# Patient Record
Sex: Male | Born: 1970
Health system: Southern US, Community
[De-identification: ages and names within clinical notes are randomized; demographics above are authoritative.]

## PROBLEM LIST (undated history)

## (undated) DIAGNOSIS — M1990 Unspecified osteoarthritis, unspecified site: Secondary | ICD-10-CM

## (undated) DIAGNOSIS — J449 Chronic obstructive pulmonary disease, unspecified: Secondary | ICD-10-CM

## (undated) HISTORY — PX: SHOULDER ARTHROSCOPY: SHX128

---

## 1998-08-18 ENCOUNTER — Encounter: Payer: Self-pay | Admitting: Emergency Medicine

## 1998-08-18 ENCOUNTER — Emergency Department (HOSPITAL_COMMUNITY): Admission: EM | Admit: 1998-08-18 | Discharge: 1998-08-18 | Payer: Self-pay | Admitting: Emergency Medicine

## 1998-11-09 ENCOUNTER — Emergency Department (HOSPITAL_COMMUNITY): Admission: EM | Admit: 1998-11-09 | Discharge: 1998-11-09 | Payer: Self-pay | Admitting: Emergency Medicine

## 1999-05-28 ENCOUNTER — Emergency Department (HOSPITAL_COMMUNITY): Admission: EM | Admit: 1999-05-28 | Discharge: 1999-05-28 | Payer: Self-pay | Admitting: *Deleted

## 1999-05-28 ENCOUNTER — Encounter: Payer: Self-pay | Admitting: Emergency Medicine

## 1999-07-04 ENCOUNTER — Emergency Department (HOSPITAL_COMMUNITY): Admission: EM | Admit: 1999-07-04 | Discharge: 1999-07-05 | Payer: Self-pay | Admitting: Emergency Medicine

## 1999-07-05 ENCOUNTER — Encounter: Payer: Self-pay | Admitting: Emergency Medicine

## 1999-07-26 ENCOUNTER — Ambulatory Visit (HOSPITAL_COMMUNITY): Admission: RE | Admit: 1999-07-26 | Discharge: 1999-07-26 | Payer: Self-pay | Admitting: Orthopedic Surgery

## 1999-07-26 ENCOUNTER — Encounter: Payer: Self-pay | Admitting: Orthopedic Surgery

## 1999-07-29 ENCOUNTER — Encounter: Admission: RE | Admit: 1999-07-29 | Discharge: 1999-08-11 | Payer: Self-pay

## 1999-09-09 ENCOUNTER — Emergency Department (HOSPITAL_COMMUNITY): Admission: EM | Admit: 1999-09-09 | Discharge: 1999-09-09 | Payer: Self-pay | Admitting: Emergency Medicine

## 2000-04-30 ENCOUNTER — Emergency Department (HOSPITAL_COMMUNITY): Admission: EM | Admit: 2000-04-30 | Discharge: 2000-04-30 | Payer: Self-pay | Admitting: Emergency Medicine

## 2000-11-16 ENCOUNTER — Encounter: Payer: Self-pay | Admitting: Emergency Medicine

## 2000-11-16 ENCOUNTER — Emergency Department (HOSPITAL_COMMUNITY): Admission: EM | Admit: 2000-11-16 | Discharge: 2000-11-16 | Payer: Self-pay | Admitting: Emergency Medicine

## 2001-05-26 ENCOUNTER — Encounter: Admission: RE | Admit: 2001-05-26 | Discharge: 2001-05-26 | Payer: Self-pay | Admitting: *Deleted

## 2001-06-06 ENCOUNTER — Encounter: Payer: Self-pay | Admitting: Emergency Medicine

## 2001-06-06 ENCOUNTER — Emergency Department (HOSPITAL_COMMUNITY): Admission: EM | Admit: 2001-06-06 | Discharge: 2001-06-06 | Payer: Self-pay | Admitting: Emergency Medicine

## 2001-09-26 ENCOUNTER — Emergency Department (HOSPITAL_COMMUNITY): Admission: EM | Admit: 2001-09-26 | Discharge: 2001-09-26 | Payer: Self-pay | Admitting: Emergency Medicine

## 2001-09-26 ENCOUNTER — Encounter: Payer: Self-pay | Admitting: Emergency Medicine

## 2001-11-27 ENCOUNTER — Emergency Department (HOSPITAL_COMMUNITY): Admission: EM | Admit: 2001-11-27 | Discharge: 2001-11-27 | Payer: Self-pay | Admitting: Emergency Medicine

## 2003-12-04 ENCOUNTER — Emergency Department (HOSPITAL_COMMUNITY): Admission: EM | Admit: 2003-12-04 | Discharge: 2003-12-04 | Payer: Self-pay

## 2004-03-21 ENCOUNTER — Encounter: Admission: RE | Admit: 2004-03-21 | Discharge: 2004-03-21 | Payer: Self-pay | Admitting: Family Medicine

## 2004-08-25 ENCOUNTER — Emergency Department (HOSPITAL_COMMUNITY): Admission: EM | Admit: 2004-08-25 | Discharge: 2004-08-25 | Payer: Self-pay | Admitting: Family Medicine

## 2004-09-30 ENCOUNTER — Emergency Department (HOSPITAL_COMMUNITY): Admission: EM | Admit: 2004-09-30 | Discharge: 2004-09-30 | Payer: Self-pay | Admitting: Emergency Medicine

## 2004-10-09 ENCOUNTER — Emergency Department (HOSPITAL_COMMUNITY): Admission: EM | Admit: 2004-10-09 | Discharge: 2004-10-09 | Payer: Self-pay | Admitting: Emergency Medicine

## 2006-08-24 ENCOUNTER — Emergency Department (HOSPITAL_COMMUNITY): Admission: EM | Admit: 2006-08-24 | Discharge: 2006-08-24 | Payer: Self-pay | Admitting: Emergency Medicine

## 2007-02-24 ENCOUNTER — Emergency Department (HOSPITAL_COMMUNITY): Admission: EM | Admit: 2007-02-24 | Discharge: 2007-02-24 | Payer: Self-pay | Admitting: Family Medicine

## 2007-07-16 ENCOUNTER — Emergency Department (HOSPITAL_COMMUNITY): Admission: EM | Admit: 2007-07-16 | Discharge: 2007-07-16 | Payer: Self-pay | Admitting: Emergency Medicine

## 2007-07-17 ENCOUNTER — Emergency Department (HOSPITAL_COMMUNITY): Admission: EM | Admit: 2007-07-17 | Discharge: 2007-07-17 | Payer: Self-pay | Admitting: Emergency Medicine

## 2007-07-24 ENCOUNTER — Emergency Department (HOSPITAL_COMMUNITY): Admission: EM | Admit: 2007-07-24 | Discharge: 2007-07-24 | Payer: Self-pay | Admitting: Emergency Medicine

## 2007-10-10 ENCOUNTER — Ambulatory Visit: Payer: Self-pay | Admitting: Critical Care Medicine

## 2007-10-10 DIAGNOSIS — J309 Allergic rhinitis, unspecified: Secondary | ICD-10-CM | POA: Insufficient documentation

## 2007-10-10 DIAGNOSIS — M1990 Unspecified osteoarthritis, unspecified site: Secondary | ICD-10-CM | POA: Insufficient documentation

## 2007-10-10 DIAGNOSIS — J45909 Unspecified asthma, uncomplicated: Secondary | ICD-10-CM | POA: Insufficient documentation

## 2007-10-11 DIAGNOSIS — F17200 Nicotine dependence, unspecified, uncomplicated: Secondary | ICD-10-CM | POA: Insufficient documentation

## 2007-10-11 DIAGNOSIS — J441 Chronic obstructive pulmonary disease with (acute) exacerbation: Secondary | ICD-10-CM | POA: Insufficient documentation

## 2007-10-30 ENCOUNTER — Ambulatory Visit: Payer: Self-pay | Admitting: Critical Care Medicine

## 2007-11-02 ENCOUNTER — Telehealth (INDEPENDENT_AMBULATORY_CARE_PROVIDER_SITE_OTHER): Payer: Self-pay | Admitting: *Deleted

## 2009-11-26 ENCOUNTER — Emergency Department (HOSPITAL_COMMUNITY): Admission: EM | Admit: 2009-11-26 | Discharge: 2009-11-26 | Payer: Self-pay | Admitting: Emergency Medicine

## 2010-01-18 ENCOUNTER — Emergency Department (HOSPITAL_COMMUNITY): Admission: EM | Admit: 2010-01-18 | Discharge: 2010-01-18 | Payer: Self-pay | Admitting: Emergency Medicine

## 2010-04-27 ENCOUNTER — Emergency Department (HOSPITAL_COMMUNITY): Admission: EM | Admit: 2010-04-27 | Discharge: 2010-04-27 | Payer: Self-pay | Admitting: Emergency Medicine

## 2010-08-09 ENCOUNTER — Encounter: Payer: Self-pay | Admitting: Family Medicine

## 2010-08-09 ENCOUNTER — Emergency Department (HOSPITAL_COMMUNITY)
Admission: EM | Admit: 2010-08-09 | Discharge: 2010-08-09 | Payer: Self-pay | Source: Home / Self Care | Admitting: Emergency Medicine

## 2010-10-04 LAB — HEPATIC FUNCTION PANEL
ALT: 17 U/L (ref 0–53)
AST: 25 U/L (ref 0–37)
Albumin: 4.2 g/dL (ref 3.5–5.2)
Alkaline Phosphatase: 66 U/L (ref 39–117)
Bilirubin, Direct: 0.1 mg/dL (ref 0.0–0.3)
Indirect Bilirubin: 0.7 mg/dL (ref 0.3–0.9)
Total Bilirubin: 0.8 mg/dL (ref 0.3–1.2)
Total Protein: 7.3 g/dL (ref 6.0–8.3)

## 2010-10-04 LAB — RAPID URINE DRUG SCREEN, HOSP PERFORMED
Amphetamines: NOT DETECTED
Barbiturates: NOT DETECTED
Benzodiazepines: POSITIVE — AB
Cocaine: POSITIVE — AB
Opiates: NOT DETECTED
Tetrahydrocannabinol: POSITIVE — AB

## 2010-10-04 LAB — CBC
HCT: 46.4 % (ref 39.0–52.0)
Hemoglobin: 16.1 g/dL (ref 13.0–17.0)
MCH: 32.7 pg (ref 26.0–34.0)
MCHC: 34.6 g/dL (ref 30.0–36.0)
MCV: 94.5 fL (ref 78.0–100.0)
Platelets: 239 10*3/uL (ref 150–400)
RBC: 4.91 MIL/uL (ref 4.22–5.81)
RDW: 12.9 % (ref 11.5–15.5)
WBC: 6.4 10*3/uL (ref 4.0–10.5)

## 2010-10-04 LAB — DIFFERENTIAL
Basophils Absolute: 0.1 10*3/uL (ref 0.0–0.1)
Basophils Relative: 1 % (ref 0–1)
Eosinophils Absolute: 0.1 10*3/uL (ref 0.0–0.7)
Eosinophils Relative: 2 % (ref 0–5)
Lymphocytes Relative: 28 % (ref 12–46)
Lymphs Abs: 1.8 10*3/uL (ref 0.7–4.0)
Monocytes Absolute: 0.6 10*3/uL (ref 0.1–1.0)
Monocytes Relative: 10 % (ref 3–12)
Neutro Abs: 3.8 10*3/uL (ref 1.7–7.7)
Neutrophils Relative %: 59 % (ref 43–77)

## 2010-10-04 LAB — BASIC METABOLIC PANEL
BUN: 5 mg/dL — ABNORMAL LOW (ref 6–23)
CO2: 22 mEq/L (ref 19–32)
Calcium: 9.1 mg/dL (ref 8.4–10.5)
Chloride: 109 mEq/L (ref 96–112)
Creatinine, Ser: 1.02 mg/dL (ref 0.4–1.5)
GFR calc Af Amer: >60 mL/min (ref >60)
GFR calc non Af Amer: >60 mL/min (ref >60)
Glucose, Bld: 97 mg/dL (ref 70–99)
Potassium: 3.5 mEq/L (ref 3.5–5.1)
Sodium: 138 mEq/L (ref 135–145)

## 2010-10-04 LAB — URINALYSIS, ROUTINE W REFLEX MICROSCOPIC
Bilirubin Urine: NEGATIVE
Glucose, UA: NEGATIVE mg/dL
Hgb urine dipstick: NEGATIVE
Ketones, ur: NEGATIVE mg/dL
Nitrite: NEGATIVE
Protein, ur: NEGATIVE mg/dL
Specific Gravity, Urine: 1.01 (ref 1.005–1.030)
Urobilinogen, UA: 0.2 mg/dL (ref 0.0–1.0)
pH: 5.5 (ref 5.0–8.0)

## 2010-10-04 LAB — ETHANOL: Alcohol, Ethyl (B): <5 mg/dL (ref 0–10)

## 2011-06-07 ENCOUNTER — Encounter: Payer: Self-pay | Admitting: *Deleted

## 2011-06-07 ENCOUNTER — Emergency Department (HOSPITAL_BASED_OUTPATIENT_CLINIC_OR_DEPARTMENT_OTHER)
Admission: EM | Admit: 2011-06-07 | Discharge: 2011-06-07 | Disposition: A | Discharge status: Home / Self Care | Payer: Self-pay | Attending: Emergency Medicine | Admitting: Emergency Medicine

## 2011-06-07 DIAGNOSIS — R062 Wheezing: Secondary | ICD-10-CM

## 2011-06-07 DIAGNOSIS — F419 Anxiety disorder, unspecified: Secondary | ICD-10-CM

## 2011-06-07 DIAGNOSIS — R05 Cough: Secondary | ICD-10-CM | POA: Insufficient documentation

## 2011-06-07 DIAGNOSIS — J449 Chronic obstructive pulmonary disease, unspecified: Secondary | ICD-10-CM | POA: Insufficient documentation

## 2011-06-07 DIAGNOSIS — F17200 Nicotine dependence, unspecified, uncomplicated: Secondary | ICD-10-CM | POA: Insufficient documentation

## 2011-06-07 DIAGNOSIS — R059 Cough, unspecified: Secondary | ICD-10-CM

## 2011-06-07 DIAGNOSIS — J4489 Other specified chronic obstructive pulmonary disease: Secondary | ICD-10-CM | POA: Insufficient documentation

## 2011-06-07 DIAGNOSIS — F411 Generalized anxiety disorder: Secondary | ICD-10-CM | POA: Insufficient documentation

## 2011-06-07 DIAGNOSIS — Z8739 Personal history of other diseases of the musculoskeletal system and connective tissue: Secondary | ICD-10-CM | POA: Insufficient documentation

## 2011-06-07 HISTORY — DX: Unspecified osteoarthritis, unspecified site: M19.90

## 2011-06-07 HISTORY — DX: Chronic obstructive pulmonary disease, unspecified: J44.9

## 2011-06-07 MED ORDER — ALBUTEROL SULFATE HFA 108 (90 BASE) MCG/ACT IN AERS: 2.0000 | INHALATION_SPRAY | RESPIRATORY_TRACT | Status: DC | PRN

## 2011-06-07 MED ORDER — ALBUTEROL SULFATE (5 MG/ML) 0.5% IN NEBU
5.0000 mg | INHALATION_SOLUTION | Freq: Once | RESPIRATORY_TRACT | Status: AC
  Administered 2011-06-07: 5 mg via RESPIRATORY_TRACT
  Filled 2011-06-07: qty 1

## 2011-06-07 MED ORDER — IPRATROPIUM BROMIDE 0.02 % IN SOLN
0.5000 mg | Freq: Once | RESPIRATORY_TRACT | Status: AC
  Administered 2011-06-07: 0.5 mg via RESPIRATORY_TRACT
  Filled 2011-06-07: qty 2.5

## 2011-06-07 MED ORDER — PREDNISONE 50 MG PO TABS
60.0000 mg | ORAL_TABLET | Freq: Once | ORAL | Status: AC
  Administered 2011-06-07: 60 mg via ORAL
  Filled 2011-06-07: qty 1

## 2011-06-07 MED ORDER — ALPRAZOLAM 0.5 MG PO TABS: 0.5000 mg | ORAL_TABLET | Freq: Three times a day (TID) | ORAL | Status: AC | PRN

## 2011-06-07 MED ORDER — PREDNISONE 20 MG PO TABS: 60.0000 mg | ORAL_TABLET | Freq: Every day | ORAL | Status: AC

## 2011-06-07 NOTE — ED Notes (Signed)
Patient states he has had a sinus congestion and cough for the last month.  Progressively gotten worse.  Also has stopped smoking Pot for the last 36 days and is feeling gitter ly and anxious. Using otc cold medications with minimal relief.

## 2011-06-07 NOTE — ED Provider Notes (Signed)
History     CSN: [id number] Arrival date & time: 06/07/2011  8:57 AM   First MD Initiated Contact with Patient 06/07/11 0901      Chief Complaint  Patient presents with  . Cough    sinus congestion    (Consider location/radiation/quality/duration/timing/severity/associated sxs/prior treatment) Patient is a 40 y.o. male presenting with cough. The history is provided by the patient.  Cough Pertinent negatives include no chest pain, no headaches, no shortness of breath and no eye redness.  pt c/o non prod cough in past couple weeks. Also nasal congestion. No purulent nasal drainage. No sinus pain/tenderness. No headache. Non prod cough. Hx asthma, copd. Smoker, in midst of trying to quit. Denies chest pain or sob. Has run out of inhaler. w copd/asthma, denies prior admit, has used mdis prn. No sore throat. No fever or chills. No known ill contacts. Also notes hx anxiety, and states stopping smoking and thc use is causing him to feel anxious at times, requests rx for same. Denies depression. No pain. w above symptoms, denies specific exacerbating or allev factors.   Past Medical History  Diagnosis Date  . Arthritis   . COPD (chronic obstructive pulmonary disease)     Past Surgical History  Procedure Date  . Shoulder arthroscopy     bone spurs and arthritis    No family history on file.  History  Substance Use Topics  . Smoking status: Current Some Day Smoker -- 1.0 packs/day    Types: Cigarettes  . Smokeless tobacco: Not on file  . Alcohol Use: Yes     rarely      Review of Systems  Constitutional: Negative for fever.  HENT: Negative for neck pain.   Eyes: Negative for redness.  Respiratory: Positive for cough. Negative for shortness of breath.   Cardiovascular: Negative for chest pain and leg swelling.  Gastrointestinal: Negative for abdominal pain.  Musculoskeletal: Negative for back pain.  Skin: Negative for rash.  Neurological: Negative for headaches.    Hematological: Does not bruise/bleed easily.  Psychiatric/Behavioral: The patient is nervous/anxious.     Allergies  Morphine and related  Home Medications   Current Outpatient Rx  Name Route Sig Dispense Refill  . NAPROXEN SODIUM 220 MG PO TABS Oral Take 500 mg by mouth 2 (two) times daily with a meal.        BP 128/82  Pulse 98  Temp(Src) 97.8 F (36.6 C) (Oral)  Resp 18  Ht 6' (1.829 m)  Wt 150 lb (68.04 kg)  BMI 20.34 kg/m2  SpO2 97%  Physical Exam  Nursing note and vitals reviewed. Constitutional: He is oriented to person, place, and time. He appears well-developed and well-nourished. No distress.  HENT:  Head: Atraumatic.  Mouth/Throat: Oropharynx is clear and moist.  Eyes: Pupils are equal, round, and reactive to light. Right eye exhibits no discharge. Left eye exhibits no discharge.  Neck: Neck supple. No tracheal deviation present.  Cardiovascular: Normal rate, regular rhythm, normal heart sounds and intact distal pulses.  Exam reveals no gallop and no friction rub.   No murmur heard. Pulmonary/Chest: Effort normal. No accessory muscle usage. No respiratory distress. He has wheezes. He has no rales.  Abdominal: Soft. He exhibits no distension. There is no tenderness.  Musculoskeletal: Normal range of motion. He exhibits no edema and no tenderness.  Neurological: He is alert and oriented to person, place, and time.  Skin: Skin is warm and dry.  Psychiatric:       Sl  anxious.     ED Course  Procedures (including critical care time)  Labs Reviewed - No data to display No results found.   No diagnosis found.    MDM  Albuterol and atrovent neb. Pred. Pt request rx anxiety at home. Will give small quantity rx xanax.    Recheck no wheezing or increased wob.     Suzi Roots, MD 06/07/11 1011

## 2011-07-30 ENCOUNTER — Emergency Department (HOSPITAL_COMMUNITY): Payer: Self-pay

## 2011-07-30 ENCOUNTER — Emergency Department (HOSPITAL_COMMUNITY)
Admission: EM | Admit: 2011-07-30 | Discharge: 2011-07-30 | Disposition: A | Discharge status: Home / Self Care | Payer: Self-pay | Attending: Emergency Medicine | Admitting: Emergency Medicine

## 2011-07-30 ENCOUNTER — Encounter (HOSPITAL_COMMUNITY): Payer: Self-pay | Admitting: *Deleted

## 2011-07-30 ENCOUNTER — Other Ambulatory Visit: Payer: Self-pay

## 2011-07-30 DIAGNOSIS — R071 Chest pain on breathing: Secondary | ICD-10-CM | POA: Insufficient documentation

## 2011-07-30 DIAGNOSIS — M549 Dorsalgia, unspecified: Secondary | ICD-10-CM | POA: Insufficient documentation

## 2011-07-30 DIAGNOSIS — R109 Unspecified abdominal pain: Secondary | ICD-10-CM | POA: Insufficient documentation

## 2011-07-30 DIAGNOSIS — M129 Arthropathy, unspecified: Secondary | ICD-10-CM | POA: Insufficient documentation

## 2011-07-30 DIAGNOSIS — J40 Bronchitis, not specified as acute or chronic: Secondary | ICD-10-CM | POA: Insufficient documentation

## 2011-07-30 DIAGNOSIS — F17200 Nicotine dependence, unspecified, uncomplicated: Secondary | ICD-10-CM | POA: Insufficient documentation

## 2011-07-30 LAB — URINALYSIS, ROUTINE W REFLEX MICROSCOPIC
Glucose, UA: NEGATIVE mg/dL
Hgb urine dipstick: NEGATIVE
Ketones, ur: 15 mg/dL — AB
Nitrite: NEGATIVE
Protein, ur: 30 mg/dL — AB
Urobilinogen, UA: 1 mg/dL (ref 0.0–1.0)

## 2011-07-30 LAB — URINE MICROSCOPIC-ADD ON

## 2011-07-30 LAB — D-DIMER, QUANTITATIVE: D-Dimer, Quant: 0.37 ug/mL-FEU (ref 0.00–0.48)

## 2011-07-30 MED ORDER — IBUPROFEN 600 MG PO TABS: 600.0000 mg | ORAL_TABLET | Freq: Four times a day (QID) | ORAL | Status: AC | PRN

## 2011-07-30 MED ORDER — KETOROLAC TROMETHAMINE 60 MG/2ML IM SOLN
60.0000 mg | Freq: Once | INTRAMUSCULAR | Status: AC
  Administered 2011-07-30: 60 mg via INTRAMUSCULAR
  Filled 2011-07-30: qty 2

## 2011-07-30 NOTE — ED Notes (Signed)
Returned from xray

## 2011-07-30 NOTE — ED Notes (Signed)
States coughing yesterday and hearing popping sound on left side of back followed by severe pain. Pt states hurts to take deep breath. Area tender to touch. Lung sounds equal bilaterally.

## 2011-07-30 NOTE — ED Provider Notes (Signed)
History     CSN: [id number]  Arrival date & time 07/30/11  7829   First MD Initiated Contact with Patient 07/30/11 0813      Chief Complaint  Patient presents with  . Back Pain    (Consider location/radiation/quality/duration/timing/severity/associated sxs/prior treatment) HPI  41 year old male presents to ED with chief complaints of back pain. Patient states he was driving yesterday when he coughed and heard a popping sound to his mid left back. He subsequently experiencing significant pain worsened with deep breathing to the affected area. Pain also worsen with movement.  He also notice he can't "put pressure to make urine".  Patient has tried Vicodin and muscle relaxant at home without relief. Nothing makes the pain better. Patient admits to be a smoker. He denies increased cough, or fever. He denies chest pain but does admits to pleuritic pain. He denies nausea, vomiting, diarrhea, abdominal pain.  Patient denies recent surgery.  Past Medical History  Diagnosis Date  . Arthritis   . COPD (chronic obstructive pulmonary disease)     Past Surgical History  Procedure Date  . Shoulder arthroscopy     bone spurs and arthritis    History reviewed. No pertinent family history.  History  Substance Use Topics  . Smoking status: Current Everyday Smoker -- 0.5 packs/day    Types: Cigarettes  . Smokeless tobacco: Not on file  . Alcohol Use: No     rarely      Review of Systems  All other systems reviewed and are negative.    Allergies  Morphine and related  Home Medications   Current Outpatient Rx  Name Route Sig Dispense Refill  . ALBUTEROL SULFATE HFA 108 (90 BASE) MCG/ACT IN AERS Inhalation Inhale 2 puffs into the lungs every 4 (four) hours as needed for wheezing. 1 Inhaler 3  . NAPROXEN SODIUM 220 MG PO TABS Oral Take 500 mg by mouth 2 (two) times daily with a meal.        BP 121/86  Pulse 105  Temp(Src) 97 F (36.1 C) (Oral)  Resp 18  SpO2 93%  Physical  Exam  Nursing note and vitals reviewed. Constitutional: He appears well-developed and well-nourished. No distress.       Awake, alert, nontoxic appearance  HENT:  Head: Normocephalic and atraumatic.  Eyes: Conjunctivae are normal. Right eye exhibits no discharge. Left eye exhibits no discharge.  Neck: Neck supple.  Cardiovascular: Normal rate and regular rhythm.   Pulmonary/Chest: Effort normal and breath sounds normal. No respiratory distress. He has no wheezes. He has no rales.   He exhibits tenderness.  Abdominal: Soft. Bowel sounds are normal. There is no tenderness. There is CVA tenderness. There is no rebound. No hernia. Hernia confirmed negative in the ventral area, confirmed negative in the right inguinal area and confirmed negative in the left inguinal area.  Musculoskeletal: Normal range of motion. He exhibits no tenderness.       Baseline ROM, no obvious new focal weakness  Neurological: He is alert.       Mental status and motor strength appears baseline for patient and situation  Skin: Skin is warm and dry. No rash noted.  Psychiatric: He has a normal mood and affect.    ED Course  Procedures (including critical care time)  Labs Reviewed - No data to display No results found.   No diagnosis found.  Results for orders placed during the hospital encounter of 07/30/11  D-DIMER, QUANTITATIVE      Component Value  Range   D-Dimer, Quant 0.37  0.00 - 0.48 (ug/mL-FEU)  URINALYSIS, ROUTINE W REFLEX MICROSCOPIC      Component Value Range   Color, Urine AMBER (*) YELLOW    APPearance CLEAR  CLEAR    Specific Gravity, Urine 1.030  1.005 - 1.030    pH 6.0  5.0 - 8.0    Glucose, UA NEGATIVE  NEGATIVE (mg/dL)   Hgb urine dipstick NEGATIVE  NEGATIVE    Bilirubin Urine SMALL (*) NEGATIVE    Ketones, ur 15 (*) NEGATIVE (mg/dL)   Protein, ur 30 (*) NEGATIVE (mg/dL)   Urobilinogen, UA 1.0  0.0 - 1.0 (mg/dL)   Nitrite NEGATIVE  NEGATIVE    Leukocytes, UA NEGATIVE  NEGATIVE     URINE MICROSCOPIC-ADD ON      Component Value Range   WBC, UA 0-2  <3 (WBC/hpf)   Bacteria, UA FEW (*) RARE    Crystals CA OXALATE CRYSTALS (*) NEGATIVE    Urine-Other MUCOUS PRESENT     Dg Chest 2 View  07/30/2011  *RADIOLOGY REPORT*  Clinical Data: Shortness of breath, left-sided pain.  Smoker, asthma.  CHEST - 2 VIEW  Comparison: 01/20 2:12  Findings: Heart and mediastinal contours are within normal limits. No focal opacities or effusions.  No acute bony abnormality.  IMPRESSION: No active cardiopulmonary disease.  Original Report Authenticated By: Cyndie Chime, M.D.      MDM  Right CVA pain after coughing. Pain is reproducible on palpation and percussion. Chest x-ray is without acute finding. Although pain is likely muscular skeletal, due to its location and the pleuritic nature and tachycardia, I will obtain a d-dimer,  EKG, and a urine to rule out PE or kidney stones.    11:17 AM Patient states pain improved significantly with Toradol. He is resting comfortably. D-dimer is negative. Chest x-ray is negative. His urine shows no evidence of hemoglobin. I've discussed with my attending who felt it is unnecessary to evaluate for kidney stone at this time. I have recommend patient to continue ibuprofen for pain. I would give referral and strict return precautions. Pt voice understanding and agree with plan.     Fayrene Helper, PA-C 07/30/11 1119

## 2011-07-30 NOTE — ED Notes (Signed)
Patient transported to X-ray 

## 2011-08-03 NOTE — ED Provider Notes (Signed)
Medical screening examination/treatment/procedure(s) were performed by non-physician practitioner and as supervising physician I was immediately available for consultation/collaboration.  Nicholes Stairs, MD 08/03/11 [phone]

## 2012-04-10 ENCOUNTER — Encounter (HOSPITAL_COMMUNITY): Payer: Self-pay

## 2012-04-10 ENCOUNTER — Emergency Department (HOSPITAL_COMMUNITY)
Admission: EM | Admit: 2012-04-10 | Discharge: 2012-04-10 | Disposition: A | Discharge status: Home / Self Care | Payer: Self-pay | Attending: Emergency Medicine | Admitting: Emergency Medicine

## 2012-04-10 DIAGNOSIS — F17200 Nicotine dependence, unspecified, uncomplicated: Secondary | ICD-10-CM | POA: Insufficient documentation

## 2012-04-10 DIAGNOSIS — Z720 Tobacco use: Secondary | ICD-10-CM

## 2012-04-10 DIAGNOSIS — J45909 Unspecified asthma, uncomplicated: Secondary | ICD-10-CM | POA: Insufficient documentation

## 2012-04-10 DIAGNOSIS — Z885 Allergy status to narcotic agent status: Secondary | ICD-10-CM | POA: Insufficient documentation

## 2012-04-10 MED ORDER — ALBUTEROL SULFATE (5 MG/ML) 0.5% IN NEBU
5.0000 mg | INHALATION_SOLUTION | Freq: Once | RESPIRATORY_TRACT | Status: AC
  Administered 2012-04-10: 5 mg via RESPIRATORY_TRACT
  Filled 2012-04-10: qty 1

## 2012-04-10 MED ORDER — PREDNISONE 20 MG PO TABS: 60.0000 mg | ORAL_TABLET | Freq: Once | ORAL | Status: DC

## 2012-04-10 MED ORDER — AZITHROMYCIN 250 MG PO TABS: 250.0000 mg | ORAL_TABLET | Freq: Every day | ORAL | Status: DC

## 2012-04-10 MED ORDER — IPRATROPIUM BROMIDE 0.02 % IN SOLN
0.5000 mg | Freq: Once | RESPIRATORY_TRACT | Status: AC
  Administered 2012-04-10: 0.5 mg via RESPIRATORY_TRACT
  Filled 2012-04-10: qty 2.5

## 2012-04-10 MED ORDER — PREDNISONE 20 MG PO TABS
60.0000 mg | ORAL_TABLET | Freq: Once | ORAL | Status: AC
  Administered 2012-04-10: 60 mg via ORAL
  Filled 2012-04-10: qty 3

## 2012-04-10 MED ORDER — ALBUTEROL SULFATE HFA 108 (90 BASE) MCG/ACT IN AERS
2.0000 | INHALATION_SPRAY | RESPIRATORY_TRACT | Status: DC | PRN
  Administered 2012-04-10: 2 via RESPIRATORY_TRACT
  Filled 2012-04-10: qty 6.7

## 2012-04-10 NOTE — ED Notes (Signed)
Pt waiting in triage room for a respiratory treatment, RT called, after the respiratory treatment, pt will be send out to the waiting room

## 2012-04-10 NOTE — ED Notes (Signed)
Breath sound clear now post albuterol treatment.

## 2012-04-10 NOTE — ED Provider Notes (Signed)
History     CSN: [id number]  Arrival date & time 04/10/12  1113   First MD Initiated Contact with Patient 04/10/12 1118      Chief Complaint  Patient presents with  . Shortness of Breath  . Cough    (Consider location/radiation/quality/duration/timing/severity/associated sxs/prior treatment) Patient is a 41 y.o. male presenting with shortness of breath and cough. The history is provided by the patient.  Shortness of Breath  The current episode started 2 days ago. The problem is moderate. Nothing relieves the symptoms. Associated symptoms include rhinorrhea, sore throat, cough, shortness of breath and wheezing. Pertinent negatives include no chest pain and no fever. Associated symptoms comments: Wheezing without fever for 2 days. Also with sinus pressure and nasal congestion. No vomiting. . His past medical history is significant for asthma and bronchiolitis.  Cough Associated symptoms include rhinorrhea, sore throat, shortness of breath and wheezing. Pertinent negatives include no chest pain and no chills. His past medical history is significant for asthma.    Past Medical History  Diagnosis Date  . Arthritis   . COPD (chronic obstructive pulmonary disease)     Past Surgical History  Procedure Date  . Shoulder arthroscopy     bone spurs and arthritis    No family history on file.  History  Substance Use Topics  . Smoking status: Current Every Day Smoker -- 0.5 packs/day    Types: Cigarettes  . Smokeless tobacco: Not on file  . Alcohol Use: No     rarely      Review of Systems  Constitutional: Negative for fever and chills.  HENT: Positive for congestion, sore throat, rhinorrhea and sinus pressure.   Respiratory: Positive for cough, shortness of breath and wheezing.   Cardiovascular: Negative for chest pain.       Chest pain with cough.  Gastrointestinal: Negative.  Negative for nausea and vomiting.  Musculoskeletal: Negative.   Skin: Negative.   Neurological:  Negative.     Allergies  Morphine and related  Home Medications   Current Outpatient Rx  Name Route Sig Dispense Refill  . ALBUTEROL SULFATE HFA 108 (90 BASE) MCG/ACT IN AERS Inhalation Inhale 2 puffs into the lungs every 4 (four) hours as needed for wheezing. 1 Inhaler 3  . HYDROCODONE-ACETAMINOPHEN 5-500 MG PO TABS Oral Take 1 tablet by mouth every 6 (six) hours as needed. For pain    . MAGNESIUM 250 MG PO TABS Oral Take 1 tablet by mouth daily.    Marland Kitchen VITAMIN C 500 MG PO TABS Oral Take 500 mg by mouth daily.      BP 112/74  Pulse 68  Temp 98.9 F (37.2 C) (Oral)  Resp 16  SpO2 97%  Physical Exam  Constitutional: He is oriented to person, place, and time. He appears well-developed and well-nourished.  HENT:  Head: Normocephalic.  Right Ear: External ear normal.  Left Ear: External ear normal.  Nose: Mucosal edema present. Right sinus exhibits frontal sinus tenderness. Left sinus exhibits frontal sinus tenderness.  Mouth/Throat: Oropharynx is clear and moist.  Neck: Normal range of motion. Neck supple.  Cardiovascular: Normal rate and normal heart sounds.   No murmur heard. Pulmonary/Chest: Effort normal. He has wheezes. He has no rales.       Full respiration wheezing bilaterally.   Abdominal: Soft. Bowel sounds are normal. He exhibits no distension. There is no tenderness.  Musculoskeletal: Normal range of motion.  Lymphadenopathy:    He has no cervical adenopathy.  Neurological: He is  alert and oriented to person, place, and time.  Skin: Skin is warm and dry. No pallor.    ED Course  Procedures (including critical care time)  Labs Reviewed - No data to display No results found.   No diagnosis found.  1. Asthmatic bronchitis 2. Tobacco abuse   MDM  Improved after neb x 1 with subjective 60% improvement in breathing. 2nd neb given with continued improvement. Will opt to treat with abx given patient's history of URI, asthma and tobacco use.          Rodena Medin, PA-C 04/10/12 1330

## 2012-04-10 NOTE — ED Notes (Addendum)
Pt in no acute resp distress. C/o SOB since Saturday night with dry cough, wheezing breath sound, hx of asthma, sat 99% RA. Pt alert, oriented, seem by PA already, respiratory therapist called for treatment.

## 2012-04-10 NOTE — ED Provider Notes (Signed)
Medical screening examination/treatment/procedure(s) were performed by non-physician practitioner and as supervising physician I was immediately available for consultation/collaboration.  Jones Skene, M.D.     Jones Skene, MD 04/10/12 1610

## 2012-09-11 ENCOUNTER — Emergency Department (HOSPITAL_COMMUNITY)
Admission: EM | Admit: 2012-09-11 | Discharge: 2012-09-11 | Disposition: A | Discharge status: Home / Self Care | Payer: Self-pay | Attending: Emergency Medicine | Admitting: Emergency Medicine

## 2012-09-11 ENCOUNTER — Encounter (HOSPITAL_COMMUNITY): Payer: Self-pay | Admitting: Emergency Medicine

## 2012-09-11 DIAGNOSIS — Z79899 Other long term (current) drug therapy: Secondary | ICD-10-CM | POA: Insufficient documentation

## 2012-09-11 DIAGNOSIS — Z8739 Personal history of other diseases of the musculoskeletal system and connective tissue: Secondary | ICD-10-CM | POA: Insufficient documentation

## 2012-09-11 DIAGNOSIS — R062 Wheezing: Secondary | ICD-10-CM | POA: Insufficient documentation

## 2012-09-11 DIAGNOSIS — F17200 Nicotine dependence, unspecified, uncomplicated: Secondary | ICD-10-CM | POA: Insufficient documentation

## 2012-09-11 DIAGNOSIS — J441 Chronic obstructive pulmonary disease with (acute) exacerbation: Secondary | ICD-10-CM | POA: Insufficient documentation

## 2012-09-11 MED ORDER — PREDNISONE 50 MG PO TABS: 50.0000 mg | ORAL_TABLET | Freq: Every day | ORAL | Status: DC

## 2012-09-11 MED ORDER — PREDNISONE 20 MG PO TABS
60.0000 mg | ORAL_TABLET | Freq: Once | ORAL | Status: AC
  Administered 2012-09-11: 60 mg via ORAL
  Filled 2012-09-11: qty 3

## 2012-09-11 MED ORDER — IPRATROPIUM BROMIDE 0.02 % IN SOLN
1.0000 mg | Freq: Once | RESPIRATORY_TRACT | Status: AC
  Administered 2012-09-11: 1 mg via RESPIRATORY_TRACT
  Filled 2012-09-11: qty 5

## 2012-09-11 MED ORDER — ALBUTEROL SULFATE (5 MG/ML) 0.5% IN NEBU
10.0000 mg | INHALATION_SOLUTION | Freq: Once | RESPIRATORY_TRACT | Status: AC
  Administered 2012-09-11: 10 mg via RESPIRATORY_TRACT
  Filled 2012-09-11: qty 2

## 2012-09-11 MED ORDER — ALBUTEROL SULFATE HFA 108 (90 BASE) MCG/ACT IN AERS: 1.0000 | INHALATION_SPRAY | Freq: Four times a day (QID) | RESPIRATORY_TRACT | Status: DC | PRN

## 2012-09-11 NOTE — ED Provider Notes (Signed)
History     CSN: [id number]  Arrival date & time 09/11/12  6644   First MD Initiated Contact with Patient 09/11/12 0900      Chief Complaint  Patient presents with  . Shortness of Breath    (Consider location/radiation/quality/duration/timing/severity/associated sxs/prior treatment) The history is provided by the patient.  Lawrence Dennis is a 42 y.o. male history of COPD, smoker, here presenting with shortness of breath. Shortness of breath and wheezing since yesterday. He ran out of his inhalers. He has cut back with his smoking but still smokes half-pack a day. Denies any fevers and has only nonproductive cough. Denies any recent admissions.   Past Medical History  Diagnosis Date  . Arthritis   . COPD (chronic obstructive pulmonary disease)     Past Surgical History  Procedure Laterality Date  . Shoulder arthroscopy      bone spurs and arthritis    No family history on file.  History  Substance Use Topics  . Smoking status: Current Every Day Smoker -- 0.50 packs/day    Types: Cigarettes  . Smokeless tobacco: Not on file  . Alcohol Use: No     Comment: rarely      Review of Systems  Respiratory: Positive for shortness of breath and wheezing.   All other systems reviewed and are negative.    Allergies  Morphine and related  Home Medications   Current Outpatient Rx  Name  Route  Sig  Dispense  Refill  . Naproxen Sodium (ALEVE) 220 MG CAPS   Oral   Take 440 mg by mouth every morning.           BP 123/84  Pulse 97  Temp(Src) 97.4 F (36.3 C) (Oral)  SpO2 98%  Physical Exam  Nursing note and vitals reviewed. Constitutional: He is oriented to person, place, and time. He appears well-developed and well-nourished.  Coughing, talking in full sentences.   HENT:  Head: Normocephalic.  Mouth/Throat: Oropharynx is clear and moist.  Eyes: Conjunctivae are normal. Pupils are equal, round, and reactive to light.  Neck: Normal range of motion. Neck  supple.  Cardiovascular: Normal rate, regular rhythm and normal heart sounds.   Pulmonary/Chest:  Slightly tachypneic. Talking in full sentences. + wheezing. No retractions.   Abdominal: Soft. Bowel sounds are normal. He exhibits no distension. There is no tenderness. There is no rebound and no guarding.  Musculoskeletal: Normal range of motion.  Neurological: He is alert and oriented to person, place, and time.  Skin: Skin is warm and dry.  Psychiatric: He has a normal mood and affect. His behavior is normal. Judgment and thought content normal.    ED Course  Procedures (including critical care time)  Labs Reviewed - No data to display No results found.   No diagnosis found.    MDM  Lawrence Dennis is a 42 y.o. male here with SOB, wheezing. Likely COPD exacerbation. No need for imaging as he has no fever or productive cough to suggest pneumonia. Will give nebs and steroids and reassess.   10:05 AM No wheezing after 2 nebs and steroids. Will d/c home on albuterol, prednisone. Recommend smoking cessation.        Richardean Canal, MD 09/11/12 1006

## 2012-09-11 NOTE — ED Notes (Signed)
Pt here for SOB started yesterday,but has gotten worse today

## 2013-04-11 ENCOUNTER — Encounter (HOSPITAL_COMMUNITY): Payer: Self-pay | Admitting: Family Medicine

## 2013-04-11 ENCOUNTER — Emergency Department (HOSPITAL_COMMUNITY): Payer: Self-pay

## 2013-04-11 ENCOUNTER — Emergency Department (HOSPITAL_COMMUNITY)
Admission: EM | Admit: 2013-04-11 | Discharge: 2013-04-11 | Disposition: A | Discharge status: Home / Self Care | Payer: Self-pay | Attending: Emergency Medicine | Admitting: Emergency Medicine

## 2013-04-11 DIAGNOSIS — R0602 Shortness of breath: Secondary | ICD-10-CM | POA: Insufficient documentation

## 2013-04-11 DIAGNOSIS — F17200 Nicotine dependence, unspecified, uncomplicated: Secondary | ICD-10-CM | POA: Insufficient documentation

## 2013-04-11 DIAGNOSIS — Z79899 Other long term (current) drug therapy: Secondary | ICD-10-CM | POA: Insufficient documentation

## 2013-04-11 DIAGNOSIS — R0789 Other chest pain: Secondary | ICD-10-CM

## 2013-04-11 DIAGNOSIS — R05 Cough: Secondary | ICD-10-CM | POA: Insufficient documentation

## 2013-04-11 DIAGNOSIS — R059 Cough, unspecified: Secondary | ICD-10-CM | POA: Insufficient documentation

## 2013-04-11 DIAGNOSIS — R062 Wheezing: Secondary | ICD-10-CM | POA: Insufficient documentation

## 2013-04-11 DIAGNOSIS — J441 Chronic obstructive pulmonary disease with (acute) exacerbation: Secondary | ICD-10-CM

## 2013-04-11 DIAGNOSIS — R5381 Other malaise: Secondary | ICD-10-CM | POA: Insufficient documentation

## 2013-04-11 DIAGNOSIS — Z8739 Personal history of other diseases of the musculoskeletal system and connective tissue: Secondary | ICD-10-CM | POA: Insufficient documentation

## 2013-04-11 DIAGNOSIS — IMO0001 Reserved for inherently not codable concepts without codable children: Secondary | ICD-10-CM | POA: Insufficient documentation

## 2013-04-11 LAB — BASIC METABOLIC PANEL
CO2: 25 mEq/L (ref 19–32)
Chloride: 103 mEq/L (ref 96–112)
Creatinine, Ser: 0.8 mg/dL (ref 0.50–1.35)
Potassium: 3.6 mEq/L (ref 3.5–5.1)

## 2013-04-11 LAB — CBC
HCT: 48.3 % (ref 39.0–52.0)
Hemoglobin: 17.4 g/dL — ABNORMAL HIGH (ref 13.0–17.0)
MCV: 91.1 fL (ref 78.0–100.0)
RDW: 12.6 % (ref 11.5–15.5)
WBC: 8.8 10*3/uL (ref 4.0–10.5)

## 2013-04-11 MED ORDER — ALBUTEROL SULFATE (5 MG/ML) 0.5% IN NEBU
5.0000 mg | INHALATION_SOLUTION | Freq: Once | RESPIRATORY_TRACT | Status: AC
  Administered 2013-04-11: 5 mg via RESPIRATORY_TRACT
  Filled 2013-04-11: qty 1

## 2013-04-11 MED ORDER — KETOROLAC TROMETHAMINE 60 MG/2ML IM SOLN
60.0000 mg | Freq: Once | INTRAMUSCULAR | Status: AC
  Administered 2013-04-11: 20 mg via INTRAMUSCULAR
  Filled 2013-04-11: qty 2

## 2013-04-11 MED ORDER — METHYLPREDNISOLONE SODIUM SUCC 125 MG IJ SOLR
125.0000 mg | Freq: Once | INTRAMUSCULAR | Status: AC
  Administered 2013-04-11: 125 mg via INTRAVENOUS
  Filled 2013-04-11: qty 2

## 2013-04-11 MED ORDER — PREDNISONE 20 MG PO TABS: 40.0000 mg | ORAL_TABLET | Freq: Every day | ORAL | Status: DC

## 2013-04-11 MED ORDER — ALBUTEROL SULFATE HFA 108 (90 BASE) MCG/ACT IN AERS
2.0000 | INHALATION_SPRAY | Freq: Once | RESPIRATORY_TRACT | Status: AC
  Administered 2013-04-11: 2 via RESPIRATORY_TRACT
  Filled 2013-04-11: qty 6.7

## 2013-04-11 NOTE — ED Notes (Signed)
Per pt chest pain that started this am feels like something is sitting on his chest. sts cough x 1 week. Pt used albuterol/atrovent this am with minimal relief.

## 2013-04-11 NOTE — ED Provider Notes (Signed)
CSN: [id number]     Arrival date & time 04/11/13  7829 History   First MD Initiated Contact with Patient 04/11/13 [phone]     Chief Complaint  Patient presents with  . Chest Pain   (Consider location/radiation/quality/duration/timing/severity/associated sxs/prior Treatment) HPI Patient has a history of COPD and states he's been coughing since this weekend. He has had minimal mucus production with his cough. He's had no fevers or chills. He complains of generalized fatigue. Denies lower sugary swelling or pain. States he's been using his albuterol nebulizer periodically and last used it this morning. He awoke with left-sided chest pressure. Patient says the pain does not radiate. The pain is worse worse with deep breath and movement of the left arm. Pain is also exacerbated by palpation of the left anterior chest wall. Patient denies any trauma. No recent travel or surgery. No personal history of thromboembolic or myocardial infarction. Patient has no family history of either other than a grandmother who had a heart attack in her 35s. Past Medical History  Diagnosis Date  . Arthritis   . COPD (chronic obstructive pulmonary disease)    Past Surgical History  Procedure Laterality Date  . Shoulder arthroscopy      bone spurs and arthritis   History reviewed. No pertinent family history. History  Substance Use Topics  . Smoking status: Current Every Day Smoker -- 0.50 packs/day    Types: Cigarettes  . Smokeless tobacco: Not on file  . Alcohol Use: No     Comment: rarely    Review of Systems  Constitutional: Positive for fatigue. Negative for fever and chills.  HENT: Negative for nosebleeds, congestion and neck pain.   Respiratory: Positive for cough, shortness of breath and wheezing.   Cardiovascular: Positive for chest pain. Negative for palpitations and leg swelling.  Gastrointestinal: Negative for vomiting and abdominal pain.  Musculoskeletal: Positive for myalgias. Negative for back  pain.  Skin: Negative for rash and wound.  Neurological: Negative for dizziness, weakness, light-headedness, numbness and headaches.  All other systems reviewed and are negative.    Allergies  Morphine and related  Home Medications   Current Outpatient Rx  Name  Route  Sig  Dispense  Refill  . albuterol (PROVENTIL) (2.5 MG/3ML) 0.083% nebulizer solution   Nebulization   Take 2.5 mg by nebulization every 6 (six) hours as needed for wheezing or shortness of breath.         . Naproxen Sodium (ALEVE) 220 MG CAPS   Oral   Take 440 mg by mouth daily as needed (pain).          Marland Kitchen albuterol (PROVENTIL HFA;VENTOLIN HFA) 108 (90 BASE) MCG/ACT inhaler   Inhalation   Inhale 1-2 puffs into the lungs every 6 (six) hours as needed for wheezing.   1 Inhaler   0    BP 134/94  Pulse 62  Temp(Src) 97.6 F (36.4 C) (Oral)  Resp 24  SpO2 100% Physical Exam  Nursing note and vitals reviewed. Constitutional: He is oriented to person, place, and time. He appears well-developed and well-nourished. No distress.  HENT:  Head: Normocephalic and atraumatic.  Mouth/Throat: Oropharynx is clear and moist.  Eyes: EOM are normal. Pupils are equal, round, and reactive to light.  Neck: Normal range of motion. Neck supple.  Cardiovascular: Normal rate and regular rhythm.   Pulmonary/Chest: Effort normal. No respiratory distress. He has wheezes (end expiratory wheezes in the right base.). He has no rales. He exhibits tenderness (tenderness to palpation of  the left pectoralis muscle. Patient's chest tightness/pain is completely reproduced with palpation.).  Mildly decreased air movement throughout results.  Abdominal: Soft. Bowel sounds are normal. He exhibits no distension and no mass. There is no tenderness. There is no rebound and no guarding.  Musculoskeletal: Normal range of motion. He exhibits no edema and no tenderness.  No calf swelling or tenderness.  Neurological: He is alert and oriented to  person, place, and time.  Patient is alert and oriented x3 with clear, goal oriented speech. Patient has 5/5 motor in all extremities. Sensation is intact to light touch.  Patient has a normal gait and walks without assistance.   Skin: Skin is warm and dry. No rash noted. No erythema.  Psychiatric: He has a normal mood and affect. His behavior is normal.    ED Course  Procedures (including critical care time) Labs Review Labs Reviewed  BASIC METABOLIC PANEL  CBC   Imaging Review No results found.  Date: 04/11/2013  Rate: 58  Rhythm: sinus bradycardia  QRS Axis: normal  Intervals: normal  ST/T Wave abnormalities: normal  Conduction Disutrbances:none  Narrative Interpretation:   Old EKG Reviewed: unchanged   MDM  Chest pain is atypical for coronary artery disease and seems more related to chest wall strain. We'll screen with EKG and troponin. Will get a chest x-ray to rule out pneumonia. Go ahead and treat for COPD exacerbation with nebs and steroids. We will reassess after period of observation in the emergency department.  Patient states he is feeling better after Toradol and nebulizer treatment. He is resting comfortably in his room. EKG and troponin are normal. We'll discharge home with short course of steroids. He has been advised to return for worsening pain, shortness of breath, fever or any concerns  Loren Racer, MD 04/11/13 1140

## 2013-09-28 ENCOUNTER — Encounter (HOSPITAL_COMMUNITY): Payer: Self-pay | Admitting: Emergency Medicine

## 2013-09-28 ENCOUNTER — Emergency Department (HOSPITAL_COMMUNITY)
Admission: EM | Admit: 2013-09-28 | Discharge: 2013-09-28 | Disposition: A | Discharge status: Home / Self Care | Payer: Self-pay | Attending: Emergency Medicine | Admitting: Emergency Medicine

## 2013-09-28 ENCOUNTER — Emergency Department (HOSPITAL_COMMUNITY): Payer: Self-pay

## 2013-09-28 DIAGNOSIS — Z79899 Other long term (current) drug therapy: Secondary | ICD-10-CM | POA: Insufficient documentation

## 2013-09-28 DIAGNOSIS — J329 Chronic sinusitis, unspecified: Secondary | ICD-10-CM | POA: Insufficient documentation

## 2013-09-28 DIAGNOSIS — J449 Chronic obstructive pulmonary disease, unspecified: Secondary | ICD-10-CM | POA: Insufficient documentation

## 2013-09-28 DIAGNOSIS — F17200 Nicotine dependence, unspecified, uncomplicated: Secondary | ICD-10-CM | POA: Insufficient documentation

## 2013-09-28 DIAGNOSIS — J4489 Other specified chronic obstructive pulmonary disease: Secondary | ICD-10-CM | POA: Insufficient documentation

## 2013-09-28 DIAGNOSIS — R918 Other nonspecific abnormal finding of lung field: Secondary | ICD-10-CM | POA: Insufficient documentation

## 2013-09-28 DIAGNOSIS — M129 Arthropathy, unspecified: Secondary | ICD-10-CM | POA: Insufficient documentation

## 2013-09-28 DIAGNOSIS — R9389 Abnormal findings on diagnostic imaging of other specified body structures: Secondary | ICD-10-CM

## 2013-09-28 MED ORDER — GUAIFENESIN-CODEINE 100-10 MG/5ML PO SOLN: 5.0000 mL | Freq: Three times a day (TID) | ORAL | Status: DC | PRN

## 2013-09-28 MED ORDER — GUAIFENESIN 100 MG/5ML PO SOLN
5.0000 mL | Freq: Once | ORAL | Status: AC
  Administered 2013-09-28: 100 mg via ORAL
  Filled 2013-09-28 (×2): qty 5

## 2013-09-28 MED ORDER — ALBUTEROL SULFATE HFA 108 (90 BASE) MCG/ACT IN AERS: 2.0000 | INHALATION_SPRAY | Freq: Four times a day (QID) | RESPIRATORY_TRACT | Status: DC | PRN

## 2013-09-28 MED ORDER — AMOXICILLIN-POT CLAVULANATE 875-125 MG PO TABS: 1.0000 | ORAL_TABLET | Freq: Two times a day (BID) | ORAL | Status: DC

## 2013-09-28 MED ORDER — SALINE SPRAY 0.65 % NA SOLN
1.0000 | Freq: Once | NASAL | Status: AC
  Administered 2013-09-28: 1 via NASAL
  Filled 2013-09-28 (×2): qty 44

## 2013-09-28 NOTE — ED Notes (Addendum)
States hes had nasal, chest, and head congestion and sinus pressure since Monday. Took mucinex with some relief.

## 2013-09-28 NOTE — Discharge Instructions (Signed)
Please keep your scheduled follow up appointment with the [facility] on March 24th for your repeat Chest X-ray. Please use Robitussin with codeine as prescribed. Please read all discharge instructions and return precautions.   Sinusitis Sinusitis is redness, soreness, and swelling (inflammation) of the paranasal sinuses. Paranasal sinuses are air pockets within the bones of your face (beneath the eyes, the middle of the forehead, or above the eyes). In healthy paranasal sinuses, mucus is able to drain out, and air is able to circulate through them by way of your nose. However, when your paranasal sinuses are inflamed, mucus and air can become trapped. This can allow bacteria and other germs to grow and cause infection. Sinusitis can develop quickly and last only a short time (acute) or continue over a long period (chronic). Sinusitis that lasts for more than 12 weeks is considered chronic.  CAUSES  Causes of sinusitis include:  Allergies.  Structural abnormalities, such as displacement of the cartilage that separates your nostrils (deviated septum), which can decrease the air flow through your nose and sinuses and affect sinus drainage.  Functional abnormalities, such as when the small hairs (cilia) that line your sinuses and help remove mucus do not work properly or are not present. SYMPTOMS  Symptoms of acute and chronic sinusitis are the same. The primary symptoms are pain and pressure around the affected sinuses. Other symptoms include:  Upper toothache.  Earache.  Headache.  Bad breath.  Decreased sense of smell and taste.  A cough, which worsens when you are lying flat.  Fatigue.  Fever.  Thick drainage from your nose, which often is green and may contain pus (purulent).  Swelling and warmth over the affected sinuses. DIAGNOSIS  Your caregiver will perform a physical exam. During the exam, your caregiver may:  Look in your nose for signs of abnormal growths in  your nostrils (nasal polyps).  Tap over the affected sinus to check for signs of infection.  View the inside of your sinuses (endoscopy) with a special imaging device with a light attached (endoscope), which is inserted into your sinuses. If your caregiver suspects that you have chronic sinusitis, one or more of the following tests may be recommended:  Allergy tests.  Nasal culture A sample of mucus is taken from your nose and sent to a lab and screened for bacteria.  Nasal cytology A sample of mucus is taken from your nose and examined by your caregiver to determine if your sinusitis is related to an allergy. TREATMENT  Most cases of acute sinusitis are related to a viral infection and will resolve on their own within 10 days. Sometimes medicines are prescribed to help relieve symptoms (pain medicine, decongestants, nasal steroid sprays, or saline sprays).  However, for sinusitis related to a bacterial infection, your caregiver will prescribe antibiotic medicines. These are medicines that will help kill the bacteria causing the infection.  Rarely, sinusitis is caused by a fungal infection. In theses cases, your caregiver will prescribe antifungal medicine. For some cases of chronic sinusitis, surgery is needed. Generally, these are cases in which sinusitis recurs more than 3 times per year, despite other treatments. HOME CARE INSTRUCTIONS   Drink plenty of water. Water helps thin the mucus so your sinuses can drain more easily.  Use a humidifier.  Inhale steam 3 to 4 times a day (for example, sit in the bathroom with the shower running).  Apply a warm, moist washcloth to your face 3 to 4 times a day, or  as directed by your caregiver.  Use saline nasal sprays to help moisten and clean your sinuses.  Take over-the-counter or prescription medicines for pain, discomfort, or fever only as directed by your caregiver. SEEK IMMEDIATE MEDICAL CARE IF:  You have increasing pain or severe  headaches.  You have nausea, vomiting, or drowsiness.  You have swelling around your face.  You have vision problems.  You have a stiff neck.  You have difficulty breathing. MAKE SURE YOU:   Understand these instructions.  Will watch your condition.  Will get help right away if you are not doing well or get worse. Document Released: 07/05/2005 Document Revised: 09/27/2011 Document Reviewed: 07/20/2011 [facility] Patient Information 2014 [place], [place].   Incidental Abnormal Radiological Finding An incidental abnormal radiologic finding is a very small mass or scar tissue, detected anywhere in the body, unrelated to the reason for your visit. With newer imaging tests and technologies, it is becoming more common to detect small masses and tissue abnormalities. It is important for you to work with your caregiver because it can sometimes be related to undiagnosed illness or other symptoms. Most often however, the finding is not causing symptoms and is not a cause for concern. TYPES OF FINDINGS Abnormal radiologic findings are often located in the kidneys or lungs, but they can also be found in the heart, liver, breasts, brain, gallbladder, uterus and other surrounding organs and tissues. There are many types of masses and tissue abnormalities that can be detected during an imaging test. These may include:  Lesions  changes in tissue due to infection, tissue death, or trauma.  Cysts a sac filled with fluid, crystals, or some other substance.  Tumors non-cancerous or cancerous solid formation. You may hear medical terms, such as, "pulmonary nodule" (a small mass in the lung) or "renal mass" (mass in the kidney). Ask your caregiver if these terms apply to your findings.  DO I NEED FURTHER DIAGNOSIS? There are many possible causes of incidental radiologic findings. Your caregiver will determine whether it requires additional screening tests, diagnostic tests, treatments, or referral to a  surgeon.Generally, very small tissue changes or masses will not require any follow up testing. Much research has been done in this area.These very small abnormalities are considered low risk of becoming a problem in the future.  Depending on the size and appearance of the finding, your caregiver may recommend additional testing.Additional testing may also be recommended if you have certain risk factors or medical conditions that increase your risk of related problems. It is a good idea to have additional testing if you have other symptoms or concerns. Sometimes, these early findings can give you a chance at early treatment and avoid problems in the future. TESTING AND DIAGNOSIS Tests and exams may be a one-time screening or periodic follow-up. Periodic follow-up will help your caregiver determine whether the abnormality is growing and becoming a concern. Tests may include:  Physical examination.  Blood tests.  Urine tests.  Imaging tests, such as abdominal ultrasound, CT scan, or MRI.  Biopsy. TREATMENT Treatment varies, depending on the cause, location, size and appearance of the finding. Treatment will also depend on your age and underlying conditions or symptoms. Sometimes treatment is not necessary at all. However, treatment may include:  Watchful waiting with periodic examination and testing.  Treatments to reduce the size of the abnormality.  Surgical or biopsy removal of the abnormality.  Additional treatments to address any underlying conditions. HOME CARE INSTRUCTIONS   See your caregiver for  follow up examination and testing as directed. It is important that you schedule appointments as directed.  Keep calm. Your caregiver will let you know if this is a routine follow up, or if there is a reason for concern. Remember, early detection can be very beneficial to you.  Follow all of your caregiver's aftercare instructions related to the reason for your visit. Document  Released: 10/20/2010 Document Revised: 09/27/2011 Document Reviewed: 10/20/2010 [facility] Patient Information 2014 [place], [place].

## 2013-09-28 NOTE — Discharge Planning (Signed)
°  Follow up appointment made for Tuesday March 24,2015 at 9:00am with the [facility]. Patient will be establishing primary care with this practice. Orange card application given, patient was instructed to take the completed application to the clinic during walk in hours before his appointment. My contact information provided for any future questions or concerns.

## 2013-09-28 NOTE — ED Provider Notes (Signed)
CSN: [id number]     Arrival date & time 09/28/13  1004 History  This chart was scribed forJennifer Jenipher Havel, PA, working with Toy Baker, MD, by Beverly Milch ED Scribe. This patient was seen in room TR06C/TR06C and the patient's care was started at 10:29 AM.    Chief Complaint  Patient presents with  . Nasal Congestion     The history is provided by the patient. No language interpreter was used.    HPI Comments: Lawrence Dennis is a 43 y.o. male with a h/o COPD and asthma who presents to the Emergency Department complaining of nasal congestion that began 4 days ago. Pt states 3 days ago he began coughing up thick yellow sputum.Pt reports associated sinus pressure headache, and chest congestion. Pt reports using hot liquids and a nebulizer treatment twice a week with to alleviate cold symptoms with some relief. Pt denies fevers.    Past Medical History  Diagnosis Date  . Arthritis   . COPD (chronic obstructive pulmonary disease)     Past Surgical History  Procedure Laterality Date  . Shoulder arthroscopy      bone spurs and arthritis    History reviewed. No pertinent family history. History  Substance Use Topics  . Smoking status: Current Every Day Smoker -- 0.50 packs/day    Types: Cigarettes  . Smokeless tobacco: Not on file  . Alcohol Use: No     Comment: rarely     Review of Systems  HENT: Positive for congestion and sinus pressure.   Respiratory: Positive for cough and chest tightness. Negative for shortness of breath.   Cardiovascular: Negative for chest pain and palpitations.  Gastrointestinal: Negative for nausea and vomiting.  All other systems reviewed and are negative.      Allergies  Morphine and related  Home Medications   Current Outpatient Rx  Name  Route  Sig  Dispense  Refill  . albuterol (PROVENTIL HFA;VENTOLIN HFA) 108 (90 BASE) MCG/ACT inhaler   Inhalation   Inhale 1-2 puffs into the lungs every 6 (six) hours as needed for  wheezing.   1 Inhaler   0   . albuterol (PROVENTIL) (2.5 MG/3ML) 0.083% nebulizer solution   Nebulization   Take 2.5 mg by nebulization every 6 (six) hours as needed for wheezing or shortness of breath.         . Naproxen Sodium (ALEVE) 220 MG CAPS   Oral   Take 440 mg by mouth daily as needed (pain).          Marland Kitchen albuterol (PROVENTIL HFA;VENTOLIN HFA) 108 (90 BASE) MCG/ACT inhaler   Inhalation   Inhale 2 puffs into the lungs every 6 (six) hours as needed for wheezing or shortness of breath.   1 Inhaler   2   . amoxicillin-clavulanate (AUGMENTIN) 875-125 MG per tablet   Oral   Take 1 tablet by mouth every 12 (twelve) hours.   14 tablet   0   . guaiFENesin-codeine 100-10 MG/5ML syrup   Oral   Take 5 mLs by mouth 3 (three) times daily as needed for cough.   120 mL   0    Triage Vitals: BP 114/70  Pulse 78  Temp(Src) 97.5 F (36.4 C) (Oral)  Resp 18  SpO2 97%  Physical Exam  Nursing note and vitals reviewed. Constitutional: He is oriented to person, place, and time. He appears well-developed and well-nourished. No distress.  HENT:  Head: Normocephalic and atraumatic.  Right Ear: Hearing, tympanic membrane,  external ear and ear canal normal.  Left Ear: Hearing, tympanic membrane, external ear and ear canal normal.  Nose: Right sinus exhibits maxillary sinus tenderness and frontal sinus tenderness. Left sinus exhibits maxillary sinus tenderness and frontal sinus tenderness.  Mouth/Throat: Uvula is midline, oropharynx is clear and moist and mucous membranes are normal. No oropharyngeal exudate.  Eyes: Conjunctivae are normal.  Neck: Normal range of motion. Neck supple. No tracheal deviation present.  Cardiovascular: Normal rate, regular rhythm and normal heart sounds.   Pulmonary/Chest: Effort normal and breath sounds normal. No accessory muscle usage or stridor. No respiratory distress. He has no wheezes. He exhibits no tenderness.  Abdominal: Soft.  Musculoskeletal:  Normal range of motion.  Lymphadenopathy:    He has no cervical adenopathy.  Neurological: He is alert and oriented to person, place, and time.  Skin: Skin is warm and dry. He is not diaphoretic.  Psychiatric: He has a normal mood and affect.    ED Course  Procedures (including critical care time) Medications  guaiFENesin (ROBITUSSIN) 100 MG/5ML solution 100 mg (100 mg Oral Given 09/28/13 1208)  sodium chloride (OCEAN) 0.65 % nasal spray 1 spray (1 spray Each Nare Given 09/28/13 1208)     DIAGNOSTIC STUDIES: Oxygen Saturation is 97% on RA, normal by my interpretation.    COORDINATION OF CARE: 10:34 AM- Pt advised of plan for treatment and pt agrees.    Labs Review Labs Reviewed - No data to display Imaging Review Dg Chest 2 View  09/28/2013   CLINICAL DATA:  COPD, smoker and cough.  EXAM: CHEST  2 VIEW  COMPARISON:  DG CHEST 2 VIEW dated 04/11/2013; DG CHEST 2 VIEW dated 07/30/2011; DG CHEST 2 VIEW dated 10/10/2007  FINDINGS: Two views demonstrate hyperinflation. Findings are consistent with emphysematous changes. There is a nodular density in the right mid lung which could be associated with a nipple shadow. Question a similar finding on the left side. Otherwise, the lungs are clear. Heart and mediastinum are within normal limits. Bony thorax is intact.  IMPRESSION: No acute cardiopulmonary disease.  Nodular density in the right mid chest as described. Finding could be related to a nipple shadow but recommend follow-up exam with nipple markers to confirm.  Hyperinflation and suspect emphysematous disease.   Electronically Signed   By: Richarda OverlieAdam  Henn M.D.   On: 09/28/2013 11:11     EKG Interpretation None      MDM   Final diagnoses:  Sinus infection  Nodular radiologic density    Filed Vitals:   09/28/13 1210  BP: 113/83  Pulse: 63  Temp:   Resp: 18   11:24 AM Patient's x-ray concerning for nodular density, recommended repeat CXR with nipple marker. Patient does not have PCP  or good follow, contacted Community Liaison regarding need for follow up for repeat imaging. She is going to [facility] on March 24th for repeat CXR with nipple markers.    Afebrile, NAD, non-toxic appearing, AAOx4.   Patient complaining of symptoms of sinusitis with concomitant URI symptoms. Lungs clear. Maxillary and frontal sinus tenderness.   Severe symptoms have been present for 7 days with purulent nasal discharge and maxillary sinus pain.  Concern for acute bacterial rhinosinusitis.  Patient discharged with Augmentin.  Instructions given for warm saline nasal wash and recommendations for follow-up with primary care physician.     I personally performed the services described in this documentation, which was scribed in my presence. The  recorded information has been reviewed and is accurate.      Jeannetta Ellis, PA-C 09/28/13 1642

## 2013-10-01 NOTE — ED Provider Notes (Signed)
Medical screening examination/treatment/procedure(s) were performed by non-physician practitioner and as supervising physician I was immediately available for consultation/collaboration.   Cedric Denison T Natasia Sanko, MD 10/01/13 0739 

## 2013-10-09 ENCOUNTER — Inpatient Hospital Stay: Payer: Self-pay

## 2014-09-12 ENCOUNTER — Encounter (HOSPITAL_COMMUNITY): Payer: Self-pay | Admitting: Emergency Medicine

## 2014-09-12 ENCOUNTER — Emergency Department (HOSPITAL_COMMUNITY): Payer: Self-pay

## 2014-09-12 ENCOUNTER — Emergency Department (HOSPITAL_COMMUNITY)
Admission: EM | Admit: 2014-09-12 | Discharge: 2014-09-12 | Disposition: A | Discharge status: Home / Self Care | Payer: Self-pay | Attending: Emergency Medicine | Admitting: Emergency Medicine

## 2014-09-12 DIAGNOSIS — Z720 Tobacco use: Secondary | ICD-10-CM | POA: Insufficient documentation

## 2014-09-12 DIAGNOSIS — Z7952 Long term (current) use of systemic steroids: Secondary | ICD-10-CM | POA: Insufficient documentation

## 2014-09-12 DIAGNOSIS — J441 Chronic obstructive pulmonary disease with (acute) exacerbation: Secondary | ICD-10-CM | POA: Insufficient documentation

## 2014-09-12 DIAGNOSIS — Z792 Long term (current) use of antibiotics: Secondary | ICD-10-CM | POA: Insufficient documentation

## 2014-09-12 DIAGNOSIS — Z8739 Personal history of other diseases of the musculoskeletal system and connective tissue: Secondary | ICD-10-CM | POA: Insufficient documentation

## 2014-09-12 LAB — I-STAT CHEM 8, ED
BUN: 7 mg/dL (ref 6–23)
Calcium, Ion: 1.15 mmol/L (ref 1.12–1.23)
Chloride: 104 mmol/L (ref 96–112)
Creatinine, Ser: 0.8 mg/dL (ref 0.50–1.35)
Glucose, Bld: 110 mg/dL — ABNORMAL HIGH (ref 70–99)
HEMATOCRIT: 53 % — AB (ref 39.0–52.0)
Hemoglobin: 18 g/dL — ABNORMAL HIGH (ref 13.0–17.0)
Potassium: 4.1 mmol/L (ref 3.5–5.1)
Sodium: 141 mmol/L (ref 135–145)
TCO2: 21 mmol/L (ref 0–100)

## 2014-09-12 LAB — CBC
HCT: 48.1 % (ref 39.0–52.0)
Hemoglobin: 16.9 g/dL (ref 13.0–17.0)
MCH: 32.6 pg (ref 26.0–34.0)
MCHC: 35.1 g/dL (ref 30.0–36.0)
MCV: 92.7 fL (ref 78.0–100.0)
PLATELETS: 223 10*3/uL (ref 150–400)
RBC: 5.19 MIL/uL (ref 4.22–5.81)
RDW: 12.4 % (ref 11.5–15.5)
WBC: 9.8 10*3/uL (ref 4.0–10.5)

## 2014-09-12 MED ORDER — AZITHROMYCIN 250 MG PO TABS: 250.0000 mg | ORAL_TABLET | Freq: Every day | ORAL | Status: DC

## 2014-09-12 MED ORDER — PREDNISONE 20 MG PO TABS
60.0000 mg | ORAL_TABLET | Freq: Once | ORAL | Status: AC
  Administered 2014-09-12: 60 mg via ORAL
  Filled 2014-09-12: qty 3

## 2014-09-12 MED ORDER — ALBUTEROL SULFATE (2.5 MG/3ML) 0.083% IN NEBU
5.0000 mg | INHALATION_SOLUTION | Freq: Once | RESPIRATORY_TRACT | Status: AC
  Administered 2014-09-12: 5 mg via RESPIRATORY_TRACT
  Filled 2014-09-12: qty 6

## 2014-09-12 MED ORDER — IPRATROPIUM BROMIDE 0.02 % IN SOLN
0.5000 mg | Freq: Once | RESPIRATORY_TRACT | Status: AC
  Administered 2014-09-12: 0.5 mg via RESPIRATORY_TRACT
  Filled 2014-09-12: qty 2.5

## 2014-09-12 MED ORDER — ALBUTEROL SULFATE HFA 108 (90 BASE) MCG/ACT IN AERS
2.0000 | INHALATION_SPRAY | Freq: Once | RESPIRATORY_TRACT | Status: AC
  Administered 2014-09-12: 2 via RESPIRATORY_TRACT
  Filled 2014-09-12: qty 6.7

## 2014-09-12 MED ORDER — PREDNISONE 20 MG PO TABS: 20.0000 mg | ORAL_TABLET | Freq: Two times a day (BID) | ORAL | Status: DC

## 2014-09-12 NOTE — ED Notes (Signed)
Patient verbally upset about wait and results. Explained to patient he is being seen as quickly as possible and needs to be reassessed by provider. PA-C made aware of same.

## 2014-09-12 NOTE — ED Notes (Addendum)
Pt experiencing SOB x2 days. Hx COPD, asthma. Does not have an inhaler. Denies chest pain/vomiting/nausea. Has had chills. Isn't able to walk any distance without "feeling winded." Also says, "when I get winded I automatically feel like I need to use the bathroom right then." No other c/c. Appears slightly SOB. Still smokes 1 ppd. No other c/c.

## 2014-09-12 NOTE — ED Provider Notes (Signed)
CSN: [id number]     Arrival date & time 09/12/14  1723 History   First MD Initiated Contact with Patient 09/12/14 1927     Chief Complaint  Patient presents with  . COPD  . Asthma  . Shortness of Breath     (Consider location/radiation/quality/duration/timing/severity/associated sxs/prior Treatment) HPI Lawrence Dennis is a 44 year old male with past medical history of COPD who presents the ER complaining of 2 days worsening cough and shortness of breath. Patient states he has a history of exacerbations of his COPD, and his symptoms the past several days feel consistent with these exacerbations. Patient states his cough is nonproductive, and he notes mild dyspnea on exertion which is worse from his baseline. Patient that she does not have a primary care following his COPD, and is currently out of nebulizers or inhalers that he has had home in the past. Patient states he does not use any maintenance therapy for his COPD. Patient denies fever, chest pain, palpitations, nausea, vomiting, dizziness, weakness, headache.   Past Medical History  Diagnosis Date  . Arthritis   . COPD (chronic obstructive pulmonary disease)    Past Surgical History  Procedure Laterality Date  . Shoulder arthroscopy      bone spurs and arthritis   History reviewed. No pertinent family history. History  Substance Use Topics  . Smoking status: Current Every Day Smoker -- 0.50 packs/day    Types: Cigarettes  . Smokeless tobacco: Not on file  . Alcohol Use: No     Comment: rarely    Review of Systems  Constitutional: Negative for fever.  HENT: Negative for trouble swallowing.   Eyes: Negative for visual disturbance.  Respiratory: Positive for cough and shortness of breath.   Cardiovascular: Negative for chest pain.  Gastrointestinal: Negative for nausea, vomiting and abdominal pain.  Genitourinary: Negative for dysuria.  Musculoskeletal: Negative for neck pain.  Skin: Negative for rash.  Neurological:  Negative for dizziness, weakness and numbness.  Psychiatric/Behavioral: Negative.       Allergies  Morphine and related  Home Medications   Prior to Admission medications   Medication Sig Start Date End Date Taking? Authorizing Provider  albuterol (PROVENTIL HFA;VENTOLIN HFA) 108 (90 BASE) MCG/ACT inhaler Inhale 1-2 puffs into the lungs every 6 (six) hours as needed for wheezing. Patient not taking: Reported on 09/12/2014 09/11/12   Richardean Canal, MD  albuterol (PROVENTIL HFA;VENTOLIN HFA) 108 (90 BASE) MCG/ACT inhaler Inhale 2 puffs into the lungs every 6 (six) hours as needed for wheezing or shortness of breath. Patient not taking: Reported on 09/12/2014 09/28/13   Lise Auer Piepenbrink, PA-C  amoxicillin-clavulanate (AUGMENTIN) 875-125 MG per tablet Take 1 tablet by mouth every 12 (twelve) hours. Patient not taking: Reported on 09/12/2014 09/28/13   Victorino Dike L Piepenbrink, PA-C  azithromycin (ZITHROMAX) 250 MG tablet Take 1 tablet (250 mg total) by mouth daily. Take first 2 tablets together, then 1 every day until finished. 09/12/14   Monte Fantasia, PA-C  guaiFENesin-codeine 100-10 MG/5ML syrup Take 5 mLs by mouth 3 (three) times daily as needed for cough. Patient not taking: Reported on 09/12/2014 09/28/13   Lise Auer Piepenbrink, PA-C  predniSONE (DELTASONE) 20 MG tablet Take 1 tablet (20 mg total) by mouth 2 (two) times daily with a meal. 09/12/14   Monte Fantasia, PA-C   BP 142/86 mmHg  Pulse 64  Temp(Src) 97.4 F (36.3 C) (Oral)  Resp 17  SpO2 99% Physical Exam  Constitutional: He is oriented to person, place,  and time. He appears well-developed and well-nourished. No distress.  HENT:  Head: Normocephalic and atraumatic.  Mouth/Throat: Oropharynx is clear and moist. No oropharyngeal exudate.  Eyes: Right eye exhibits no discharge. Left eye exhibits no discharge. No scleral icterus.  Neck: Normal range of motion.  Cardiovascular: Normal rate, regular rhythm and normal heart sounds.    No murmur heard. Pulmonary/Chest: Effort normal. No accessory muscle usage. No tachypnea. No respiratory distress. He has wheezes in the right upper field and the left upper field.  Abdominal: Soft. There is no tenderness.  Musculoskeletal: Normal range of motion. He exhibits no edema or tenderness.  Neurological: He is alert and oriented to person, place, and time. No cranial nerve deficit. Coordination normal.  Skin: Skin is warm and dry. No rash noted. He is not diaphoretic.  Psychiatric: He has a normal mood and affect.  Nursing note and vitals reviewed.   ED Course  Procedures (including critical care time) Labs Review Labs Reviewed  I-STAT CHEM 8, ED - Abnormal; Notable for the following:    Glucose, Bld 110 (*)    Hemoglobin 18.0 (*)    HCT 53.0 (*)    All other components within normal limits  CBC    Imaging Review Dg Chest 2 View  09/12/2014   CLINICAL DATA:  Shortness of breath and cough for 2 months.  EXAM: CHEST  2 VIEW  COMPARISON:  09/28/2013 and 08/09/2010  FINDINGS: Again noted is hyperinflation which is similar to the prior examinations. Lungs are clear without airspace disease or pulmonary edema. Heart and mediastinum are within normal limits. The trachea is midline. No evidence for pleural effusions. No acute bone abnormality.  IMPRESSION: No active cardiopulmonary disease.   Electronically Signed   By: Richarda Overlie M.D.   On: 09/12/2014 18:36     EKG Interpretation   Date/Time:  Thursday September 12 2014 17:30:39 EST Ventricular Rate:  58 PR Interval:  147 QRS Duration: 82 QT Interval:  444 QTC Calculation: 436 R Axis:   82 Text Interpretation:  Sinus rhythm Consider right atrial enlargement ST  elev, probable normal early repol pattern Baseline wander in lead(s) I III  aVL V2 No significant change since last tracing Confirmed by Anitra Lauth  MD,  Alphonzo Lemmings ([id number]) on 09/12/2014 9:03:04 PM      MDM   Final diagnoses:  COPD exacerbation    Patient has  signs and symptoms consistent with COPD exacerbation. Throughout ER stay patient is afebrile, non-tachycardic, nontachypneic, non-hypoxic, well-appearing and in no acute distress. Patient given DuoNeb reports resolution of symptoms back to his baseline. Patient also given steroids in the ED. Wells criteria 0.0 for PE.  Patient denying any other symptoms, stating his symptoms the past 2 days feel consistent with COPD exacerbations that he has had in the past. Chest radiograph unremarkable for acute pathology. No concern for pneumonia, however due to patient's comorbidities and poor follow-up we'll place patient on azithromycin to cover possible atypical pneumonia. Patient requesting to leave after albuterol treatment. Patient ambulates without difficulty maintaining O2 saturation 97%. Patient given an inhaler, prednisone prescription strongly encouraged to follow up with PCP to help regulate his COPD and prescribe maintenance therapy should it be needed. I discussed return precautions with patient, patient verbalizes understanding and agreement of this plan. I encouraged patient to call or return to the ER should he have any questions or concerns.  BP 142/86 mmHg  Pulse 64  Temp(Src) 97.4 F (36.3 C) (Oral)  Resp 17  SpO2  99%  Signed,  Ladona MowJoe Kazuko Clemence, PA-C 9:41 PM  Patient discussed with Dr. Gwyneth SproutWhitney Plunkett, MD   Monte FantasiaJoseph W Aleesa Sweigert, PA-C 09/12/14 [id number]  Gwyneth SproutWhitney Plunkett, MD 09/13/14 0010

## 2014-09-12 NOTE — ED Notes (Signed)
Pt ambulated with writer O2 sats at 97% Room Air, HR at 100

## 2014-09-12 NOTE — Discharge Instructions (Signed)
Chronic Obstructive Pulmonary Disease Exacerbation °Chronic obstructive pulmonary disease (COPD) is a common lung condition in which airflow from the lungs is limited. COPD is a general term that can be used to describe many different lung problems that limit airflow, including chronic bronchitis and emphysema. COPD exacerbations are episodes when breathing symptoms become much worse and require extra treatment. Without treatment, COPD exacerbations can be life threatening, and frequent COPD exacerbations can cause further damage to your lungs. °CAUSES  °· Respiratory infections.   °· Exposure to smoke.   °· Exposure to air pollution, chemical fumes, or dust. °Sometimes there is no apparent cause or trigger. °RISK FACTORS °· Smoking cigarettes. °· Older age. °· Frequent prior COPD exacerbations. °SIGNS AND SYMPTOMS  °· Increased coughing.   °· Increased thick spit (sputum) production.   °· Increased wheezing.   °· Increased shortness of breath.   °· Rapid breathing.   °· Chest tightness. °DIAGNOSIS  °Your medical history, a physical exam, and tests will help your health care provider make a diagnosis. Tests may include: °· A chest X-ray. °· Basic lab tests. °· Sputum testing. °· An arterial blood gas test. °TREATMENT  °Depending on the severity of your COPD exacerbation, you may need to be admitted to a hospital for treatment. Some of the treatments commonly used to treat COPD exacerbations are:  °· Antibiotic medicines.   °· Bronchodilators. These are drugs that expand the air passages. They may be given with an inhaler or nebulizer. Spacer devices may be needed to help improve drug delivery. °· Corticosteroid medicines. °· Supplemental oxygen therapy.   °HOME CARE INSTRUCTIONS  °· Do not smoke. Quitting smoking is very important to prevent COPD from getting worse and exacerbations from happening as often. °· Avoid exposure to all substances that irritate the airway, especially to tobacco smoke.   °· If you were  prescribed an antibiotic medicine, finish it all even if you start to feel better. °· Take all medicines as directed by your health care provider. It is important to use correct technique with inhaled medicines. °· Drink enough fluids to keep your urine clear or pale yellow (unless you have a medical condition that requires fluid restriction). °· Use a cool mist vaporizer. This makes it easier to clear your chest when you cough.   °· If you have a home nebulizer and oxygen, continue to use them as directed.   °· Maintain all necessary vaccinations to prevent infections.   °· Exercise regularly.   °· Eat a healthy diet.   °· Keep all follow-up appointments as directed by your health care provider. °SEEK IMMEDIATE MEDICAL CARE IF: °· You have worsening shortness of breath.   °· You have trouble talking.   °· You have severe chest pain. °· You have blood in your sputum.  °· You have a fever. °· You have weakness, vomit repeatedly, or faint.   °· You feel confused.   °· You continue to get worse. °MAKE SURE YOU:  °· Understand these instructions. °· Will watch your condition. °· Will get help right away if you are not doing well or get worse. °Document Released: 05/02/2007 Document Revised: 11/19/2013 Document Reviewed: 03/09/2013 °ExitCare® Patient Information ©2015 [employer]. This information is not intended to replace advice given to you by your health care provider. Make sure you discuss any questions you have with your health care provider. ° ° °Emergency Department Resource Guide °1) Find a Doctor and Pay Out of Pocket °Although you won't have to find out who is covered by your insurance plan, it is a good idea to ask around and get recommendations.   You will then need to call the office and see if the doctor you have chosen will accept you as a new patient and what types of options they offer for patients who are self-pay. Some doctors offer discounts or will set up payment plans for their patients who do not  have insurance, but you will need to ask so you aren't surprised when you get to your appointment. ° °2) Contact Your Local Health Department °Not all health departments have doctors that can see patients for sick visits, but many do, so it is worth a call to see if yours does. If you don't know where your local health department is, you can check in your phone book. The CDC also has a tool to help you locate your state's health department, and many state websites also have listings of all of their local health departments. ° °3) Find a Walk-in Clinic °If your illness is not likely to be very severe or complicated, you may want to try a walk in clinic. These are popping up all over the country in pharmacies, drugstores, and shopping centers. They're usually staffed by nurse practitioners or physician assistants that have been trained to treat common illnesses and complaints. They're usually fairly quick and inexpensive. However, if you have serious medical issues or chronic medical problems, these are probably not your best option. ° °No Primary Care Doctor: °- Call [facility] at  832-8000 - they can help you locate a primary care doctor that  accepts your insurance, provides certain services, etc. °- Physician Referral Service- 1-800-533-3463 ° °Chronic Pain Problems: °Organization         Address  Phone   Notes  °[facility]  (336) 297-2271 Patients need to be referred by their primary care doctor.  ° °Medication Assistance: °Organization         Address  Phone   Notes  °[place] [facility] [address] °[place], [place] [address] (336) 641-8030 --Must be a resident of [place] °-- Must have NO insurance coverage whatsoever (no Medicaid/ Medicare, etc.) °-- The pt. MUST have a primary care doctor that directs their care regularly and follows them in the community °  °[facility]  (866) 331-1348   °[employer]  (888) 892-1162   ° °Agencies that  provide inexpensive medical care: °Organization         Address  Phone   Notes  °[facility]  (336) 832-8035   °[facility]    (336) 832-7272   °[facility] [address] °[place], [place] [address] (336) 832-4777   °[facility] [address], °[place] (336) 271-4999   °[facility]    (336) 373-0678   °[facility]    (336) 272-1050   °[facility] ° [address], [place] Phone:  (336) 832-4444, Fax:  (336) 832-4440 Hours of Operation:  9 am - 6 pm, M-F.  Also accepts Medicaid/Medicare and self-pay.  °[facility] ° [address], [place] Phone: (336) 832-3150, Fax: (336) 832-3151. Hours of Operation:  8:30 am - 5:30 pm, M-F.  Also accepts Medicaid and self-pay.  °[facility] [place] [address], [place] Phone: (336) 878-6027   °[facility] [address], [place], [place] (336)723-1848, Ext. 123 Mondays & Thursdays: 7-9 AM.  First 15 patients are seen on a first come,   first serve basis. °  ° °Medicaid-accepting [place] Providers: ° °Organization         Address  Phone   Notes  °[facility] [address], [place] (336) 641-2100 Also accepts self-pay patients.  °[facility] [address], [place] ° (336) 856-9996   °[facility] [address], [place] (336) 288-8857   °[facility] [address] [place] [address], [place] (336) 299-7000   °Veita Bland [address], [place]  ° (336) 373-1557 Only accepts [place] Access Medicaid patients after they have their name applied to their card.  ° °Self-Pay (no insurance) in [place]: ° °Organization         Address  Phone   Notes  °Sickle Cell Patients, [facility] [address], [place] (336) 832-1970   °[facility] [address], [place] (336) 832-4400   °[facility] ° [address], [place] (336) 992-4800   °[facility]/Dr. Osei-Bonsu ° [address] [place] [address], [place] or [address], [place] (336) 841-8500 Phone number for both [place] and [place] locations is the same.  °[facility] [address], [place] (336) 299-0000   °[facility] [address] [place] [address], [place] or [address] (336) 852-7530 °(336) 878-2260   °[facility] [address], [place] (336) 350-1642, phone; (336) 294-5005, fax Sees patients 1st and 3rd Saturday of every month.  Must not qualify for public or private insurance (i.e. Medicaid, Medicare, [place] Health Choice, Veterans' Benefits) • Household income should be no more than 200% of the poverty level •The clinic cannot treat you if you are pregnant or think you are pregnant • Sexually transmitted diseases are not treated at the clinic.  ° ° °Dental Care: °Organization         Address  Phone  Notes  °[facility] [address], [place] (336) 641-6152 Accepts children up to age 21 who are enrolled in Medicaid or [place] Health Choice; pregnant women with a Medicaid card; and children who have applied for Medicaid or [place] Health Choice, but were declined, whose parents can pay a reduced fee at time of service.  °[facility] [place]  [address], [place] (336) 641-7733 Accepts children up to age 21 who are enrolled in Medicaid or [place] Health Choice; pregnant women with a Medicaid card; and children who have applied for Medicaid or [place] Health Choice, but were declined, whose parents can pay a reduced fee at time of service.  °[facility] ° [address],  (336) 641-4533 Patients are seen by appointment only. Walk-ins  are not accepted. [facility] will see patients 18 years of age and older. °Monday - Tuesday (8am-5pm) °Most Wednesdays (8:30-5pm) °$30 per visit, cash only  °[facility] ° [address], [place] (336) 641-4533 Patients are seen by appointment only. Walk-ins are not accepted. [facility] will see patients 18 years of age and older. °One Wednesday Evening (Monthly: Volunteer Based).  $30 per visit, cash only  °[facility]  (919) 537-3737 for adults; Children under age 4, call Graduate Pediatric Dentistry at (919) [phone]. Children aged 4-14, please   call (919) [phone] to request a pediatric application. ° Dental services are provided in all areas of dental care including fillings, crowns and bridges, complete and partial dentures, implants, gum treatment, root canals, and extractions. Preventive care is also provided. Treatment is provided to both adults and children. °Patients are selected via a lottery and there is often a waiting list. °  °[facility] [address], °[place] ° (336) 763-8833 [email or web address] °  °[facility] [address], [place], [place] (336)723-1848, Ext. 123 Second and Fourth Thursday of each month, opens at 6:30 AM; Clinic ends at 9 AM.  Patients are seen on a first-come first-served basis, and a limited number are seen during each clinic.  ° °[facility] ° [address], [place], [place] (336) 723-7904   Eligibility Requirements °You must have lived in [place], [place], or [place] counties for at least the last three months. °  You cannot be eligible for state or federal sponsored healthcare insurance, including Veterans Administration, Medicaid, or Medicare. °  You generally cannot be eligible for healthcare insurance through your employer.  °  How to apply: °Eligibility screenings are held every Tuesday and Wednesday afternoon from 1:00 pm until 4:00 pm. You do not need an appointment  for the interview!  °[facility] [address], [place], [place] 336-631-2330   °[facility]  336-342-8273   °[facility]  336-703-3100   °[facility]  336-570-6415   ° °Behavioral Health Resources in the Community: °Intensive Outpatient Programs °Organization         Address  Phone  Notes  °[facility] [address], [place], [place] 336-878-6098   °[facility] [address], [place], [place] 336-832-9800   °[facility] [address], [place], [place] ° 336-882-2125   °[facility] [address],  °[place], [place] 1-800-853-5163 or 336-641-4981   °Substance Abuse Resources °Organization         Address  Phone  Notes  °[facility]  336-882-2125   °[facility]  336-784-9470   °[facility]  336-285-9073   °[facility]  336-845-3988   °Residential & Outpatient Substance Abuse Program  1-800-659-3381   °Psychological Services °Organization         Address  Phone  Notes  °[facility]  336- 832-9600   °[facility]  336- 378-7881   °[facility] [address], [place] 1-800-853-5163 or 336-641-4981   ° °Mobile Crisis Teams °Organization         Address  Phone  Notes  °[facility], Mobile Crisis Care Unit  1-877-626-1772   °Assertive °[facility] ° [address], [place] 336-834-9664   °Sharon DeEsch [address] °[place] 336-554-5454   ° °Self-Help/Support Groups °Organization         Address  Phone             Notes  °[facility] - variety of support groups  336- 373-1402 Call for more information  °Narcotics Anonymous (NA), [facility] [address], °[place]  2 meetings at this location  ° °Residential Treatment Programs °Organization         Address  Phone  Notes  °[facility] [address],    °[place]  1-866-801-8205   °[facility] ° [address], [place], [place] 704-293-8524   °[facility] [address], [place] 336-845-3988 Admissions: 8am-3pm M-F  °[facility] [address]    °[place], [place] 336-841-1104   °[facility] [address], [place], [place] 336-379-7146   °[facility] [address]  °[place], [place] 336-285-9073   °[facility] Intensive Outpatient [address], [place], [place] 336-852-3033   °[facility] [address]  °[place], [place] 1-877-615-2722 or 336-784-9470   °[facility] [address] [place], [place] 336-227-7417 Accepts Medicaid  °[facility] [address]  ° [place] 1-800-659-3381 Substance Abuse/Addiction Treatment  ° °[place] Behavioral Health Resources °Organization         Address  Phone  Notes  °[facility]  (888) 581-9988   °Julie Brannon, PhD [address] [place], [place]   (336) 349-5553 or (336) 951-0000   °[facility]   [address] °[place], [place] (336) 349-4454   °[facility] [address], [place], [place] (336) 342-8316 Insurance/Medicaid/sponsorship through [facility]  °[facility] [address]                                    [place], [place] (336) 342-8316 Therapy/tele-psych/case  °[facility] [address]  ° [place], [place] (336) 349-2233    °Dr. Arfeen  (336) 349-4544   °[facility] [place]  [employer] [place] [facility] 1) [address], [place] °2) [address], [place] °3)  [address], [place] (336) 349-3220 °(336) 342-7768 ° °(336) 342-8140   °[place] [facility] (336) 342-1394 or (336) 342-3537 (After Hours)    ° ° ° °

## 2015-03-10 ENCOUNTER — Emergency Department (HOSPITAL_COMMUNITY): Payer: Self-pay

## 2015-03-10 ENCOUNTER — Emergency Department (HOSPITAL_COMMUNITY)
Admission: EM | Admit: 2015-03-10 | Discharge: 2015-03-11 | Disposition: A | Discharge status: Home / Self Care | Payer: Self-pay | Attending: Emergency Medicine | Admitting: Emergency Medicine

## 2015-03-10 ENCOUNTER — Encounter (HOSPITAL_COMMUNITY): Payer: Self-pay

## 2015-03-10 DIAGNOSIS — M1990 Unspecified osteoarthritis, unspecified site: Secondary | ICD-10-CM | POA: Insufficient documentation

## 2015-03-10 DIAGNOSIS — Z720 Tobacco use: Secondary | ICD-10-CM | POA: Insufficient documentation

## 2015-03-10 DIAGNOSIS — L02512 Cutaneous abscess of left hand: Secondary | ICD-10-CM | POA: Insufficient documentation

## 2015-03-10 DIAGNOSIS — Z79899 Other long term (current) drug therapy: Secondary | ICD-10-CM | POA: Insufficient documentation

## 2015-03-10 DIAGNOSIS — J449 Chronic obstructive pulmonary disease, unspecified: Secondary | ICD-10-CM | POA: Insufficient documentation

## 2015-03-10 MED ORDER — OXYCODONE-ACETAMINOPHEN 5-325 MG PO TABS
1.0000 | ORAL_TABLET | Freq: Once | ORAL | Status: AC
  Administered 2015-03-10: 1 via ORAL

## 2015-03-10 MED ORDER — LIDOCAINE HCL (PF) 1 % IJ SOLN
5.0000 mL | Freq: Once | INTRAMUSCULAR | Status: AC
Start: 2015-03-11 — End: 2015-03-11
  Administered 2015-03-11: 5 mL
  Filled 2015-03-10: qty 5

## 2015-03-10 MED ORDER — OXYCODONE-ACETAMINOPHEN 5-325 MG PO TABS
ORAL_TABLET | ORAL | Status: DC
Start: 2015-03-10 — End: 2015-03-11
  Filled 2015-03-10: qty 1

## 2015-03-10 MED ORDER — OXYCODONE-ACETAMINOPHEN 5-325 MG PO TABS
1.0000 | ORAL_TABLET | Freq: Once | ORAL | Status: AC
  Administered 2015-03-11: 1 via ORAL
  Filled 2015-03-10: qty 1

## 2015-03-10 MED ORDER — IBUPROFEN 800 MG PO TABS
800.0000 mg | ORAL_TABLET | Freq: Once | ORAL | Status: AC
  Administered 2015-03-11: 800 mg via ORAL
  Filled 2015-03-10: qty 1

## 2015-03-10 NOTE — ED Provider Notes (Signed)
CSN: [id number]   Arrival date & time 03/10/15 2043  History  This chart was scribed for Zadie Rhine, MD by Bethel Born, ED Scribe. This patient was seen in room A13C/A13C and the patient's care was started at 11:44 PM.  Chief Complaint  Patient presents with  . Finger Injury  Patient gave verbal permission to utilize photo for medical documentation only The image was not stored on any personal device  HPI Patient is a 44 y.o. male presenting with hand pain. The history is provided by the patient. No language interpreter was used.  Hand Pain This is a new problem. The current episode started yesterday. The problem occurs constantly. The problem has been gradually worsening. Pertinent negatives include no chest pain and no abdominal pain. Nothing relieves the symptoms. Treatments tried: Percocet. The treatment provided no relief.   Lawrence Dennis is a 44 y.o. male who presents to the Emergency Department complaining of increasing pain and  swelling in the left middle finger with gradual onset yesterday morning. The constant pain is described as throbbing and rated 10/10 in severity. Percocet provided insufficient pain relief in the ED. 2 weeks ago he dropped a cabinet on the finger sustaining a laceration. At that time he cleaned the laceration and applied Neosporin.  Associated symptoms include increased warmth in the left long finger. Pt denies other injury and fever. No personal history of DM. Tobacco+  Past Medical History  Diagnosis Date  . Arthritis   . COPD (chronic obstructive pulmonary disease)     Past Surgical History  Procedure Laterality Date  . Shoulder arthroscopy      bone spurs and arthritis    No family history on file.  Social History  Substance Use Topics  . Smoking status: Current Every Day Smoker -- 0.50 packs/day    Types: Cigarettes  . Smokeless tobacco: None  . Alcohol Use: No     Comment: rarely     Review of Systems  Cardiovascular: Negative for  chest pain.  Gastrointestinal: Negative for abdominal pain.  Musculoskeletal:       Pain, swelling, and warmth in the left long finger  All other systems reviewed and are negative.   Home Medications   Prior to Admission medications   Medication Sig Start Date End Date Taking? Authorizing Provider  albuterol (PROVENTIL HFA;VENTOLIN HFA) 108 (90 BASE) MCG/ACT inhaler Inhale 1-2 puffs into the lungs every 6 (six) hours as needed for wheezing. Patient not taking: Reported on 09/12/2014 09/11/12   Richardean Canal, MD  albuterol (PROVENTIL HFA;VENTOLIN HFA) 108 (90 BASE) MCG/ACT inhaler Inhale 2 puffs into the lungs every 6 (six) hours as needed for wheezing or shortness of breath. Patient not taking: Reported on 09/12/2014 09/28/13   Francee Piccolo, PA-C  amoxicillin-clavulanate (AUGMENTIN) 875-125 MG per tablet Take 1 tablet by mouth every 12 (twelve) hours. Patient not taking: Reported on 09/12/2014 09/28/13   Francee Piccolo, PA-C  azithromycin (ZITHROMAX) 250 MG tablet Take 1 tablet (250 mg total) by mouth daily. Take first 2 tablets together, then 1 every day until finished. 09/12/14   Ladona Mow, PA-C  guaiFENesin-codeine 100-10 MG/5ML syrup Take 5 mLs by mouth 3 (three) times daily as needed for cough. Patient not taking: Reported on 09/12/2014 09/28/13   Francee Piccolo, PA-C  predniSONE (DELTASONE) 20 MG tablet Take 1 tablet (20 mg total) by mouth 2 (two) times daily with a meal. 09/12/14   Ladona Mow, PA-C    Allergies  Morphine and related  Triage Vitals: BP 138/65 mmHg  Pulse 51  Temp(Src) 97.6 F (36.4 C) (Oral)  Resp 14  SpO2 98% Physical Exam  Nursing note and vitals reviewed.  CONSTITUTIONAL: Well developed/well nourished HEAD: Normocephalic/atraumatic EYES: EOMI/PERRL ENMT: Mucous membranes moist NECK: supple no meningeal signs SPINE/BACK:entire spine nontender CV: S1/S2 noted, no murmurs/rubs/gallops noted LUNGS: Lungs are clear to auscultation bilaterally, no  apparent distress ABDOMEN: soft, nontender, no rebound or guarding, bowel sounds noted throughout abdomen GU:no cva tenderness NEURO: Pt is awake/alert/appropriate, moves all extremitiesx4.  No facial droop.   EXTREMITIES: pulses normal/equal, full ROM, see photo, pt abl to flex left middle finger     SKIN: warm, color normal PSYCH: no abnormalities of mood noted, alert and oriented to situation   ED Course  NERVE BLOCK Date/Time: 03/11/2015 1:28 AM Performed by: Zadie Rhine Authorized by: Zadie Rhine Consent: Verbal consent obtained. Patient identity confirmed: verbally with patient and arm band Indications: pain relief Body area: upper extremity Nerve: digital Laterality: left Preparation: Patient was prepped and draped in the usual sterile fashion. Local anesthetic: lidocaine 1% without epinephrine Anesthetic total: 3 ml Outcome: pain improved Patient tolerance: Patient tolerated the procedure well with no immediate complications    INCISION AND DRAINAGE Performed by: Joya Gaskins Consent: Verbal consent obtained. Risks and benefits: risks, benefits and alternatives were discussed Type: abscess  Body area: left middle finger  Anesthesia: digital block  Incision was made with a scalpel. Complexity: complex Blunt dissection to break up loculations  Drainage: purulent  Drainage amount: moderate   Patient tolerance: Patient tolerated the procedure well with no immediate complications.     DIAGNOSTIC STUDIES: Oxygen Saturation is 98% on RA, normal by my interpretation.    COORDINATION OF CARE: 11:49 PM Discussed treatment plan which includes XR, pain management, and I&D with pt at bedside and pt agreed to plan.   Imaging Review Dg Finger Middle Left  03/10/2015   CLINICAL DATA:  Patient dropped a cabinet on the left middle finger 2 weeks ago. Continued swelling and pain yesterday.  EXAM: LEFT MIDDLE FINGER 2+V  COMPARISON:  None.  FINDINGS:  Soft tissue swelling about the mid and distal left third finger. No soft tissue gas or soft tissue foreign body identified. Bones appear intact. No evidence of acute fracture or dislocation. No focal bone lesion or bone destruction. Bone cortex and trabecular architecture appear intact.  IMPRESSION: Soft tissue swelling.  No acute bony abnormalities.   Electronically Signed   By: Burman Nieves M.D.   On: 03/10/2015 21:28   I, Zadie Rhine, MD, personally reviewed and evaluated these images as part of my medical decision-making.   1:29 AM Pt with localized abscess to finger No signs of deep space infection/tenosynotivitis He had good drainage Will place on doxycycline and advised recheck in 48 hours  MDM   Final diagnoses:  Abscess of finger of left hand     Nursing notes including past medical history and social history reviewed and considered in documentation xrays/imaging reviewed by myself and considered during evaluation   I personally performed the services described in this documentation, which was scribed in my presence. The recorded information has been reviewed and is accurate.    Zadie Rhine, MD 03/11/15 0130

## 2015-03-10 NOTE — ED Notes (Signed)
Pt dropped a cabinet on left hand 2 weeks ago and yesterday his middle finger started swelling last night. Has some redness and swelling around middle finger on left hand at first knuckle.

## 2015-03-11 MED ORDER — DOXYCYCLINE HYCLATE 100 MG PO CAPS: 100.0000 mg | ORAL_CAPSULE | Freq: Two times a day (BID) | ORAL | Status: DC

## 2015-03-11 NOTE — Discharge Instructions (Signed)

## 2015-03-12 NOTE — Care Management (Signed)
ED CM received call from patient regarding not being able to afford his antibiotic Reviewed record, no health insurance listed. Patient states he will not have the money until next week. Pt is eligible for MATCH. Discussed MATCH program and the guidelines including the  $3 co-pay per prescription.  Pt verbalized understanding and is agreeable with plan. Pt enrolled and MATCH letter printed and faxed to [facility] [phone] in [place] at patient's request, fax confirmation received.   Pt voiced understanding and appreciation.Patientreminded of the importance of followiing up with a PCP. Pt verbalized understanding teach back done. No further ED CM needs identified.

## 2015-04-08 ENCOUNTER — Emergency Department (HOSPITAL_COMMUNITY): Payer: Self-pay

## 2015-04-08 ENCOUNTER — Emergency Department (HOSPITAL_COMMUNITY)
Admission: EM | Admit: 2015-04-08 | Discharge: 2015-04-08 | Discharge status: Against Medical Advice | Payer: Self-pay | Attending: Emergency Medicine | Admitting: Emergency Medicine

## 2015-04-08 ENCOUNTER — Encounter (HOSPITAL_COMMUNITY): Payer: Self-pay

## 2015-04-08 DIAGNOSIS — Z720 Tobacco use: Secondary | ICD-10-CM | POA: Insufficient documentation

## 2015-04-08 DIAGNOSIS — R109 Unspecified abdominal pain: Secondary | ICD-10-CM | POA: Insufficient documentation

## 2015-04-08 DIAGNOSIS — R1110 Vomiting, unspecified: Secondary | ICD-10-CM | POA: Insufficient documentation

## 2015-04-08 DIAGNOSIS — J449 Chronic obstructive pulmonary disease, unspecified: Secondary | ICD-10-CM | POA: Insufficient documentation

## 2015-04-08 DIAGNOSIS — R05 Cough: Secondary | ICD-10-CM | POA: Insufficient documentation

## 2015-04-08 DIAGNOSIS — R197 Diarrhea, unspecified: Secondary | ICD-10-CM | POA: Insufficient documentation

## 2015-04-08 NOTE — ED Notes (Signed)
I called patient name to collect labs and no one answered 

## 2015-04-08 NOTE — ED Notes (Signed)
Pt c/o cough x 1 week and abdominal pain and n/v/d x 1 day.  Pain score 5/10.  Pt reports that nothing makes symptoms better or worse.

## 2015-04-08 NOTE — ED Notes (Signed)
Pt has been called by Phlebotomy, Radiology and staff for rooming w/o an answer.  It is assumed the Pt left.

## 2016-02-23 IMAGING — CR DG CHEST 2V
2 series · 2 of 2 positions shown · non-contrast
Comparison: DG CHEST 2 VIEW dated 04/11/2013; DG CHEST 2 VIEW dated
07/30/2011; DG CHEST 2 VIEW dated 10/10/2007

CLINICAL DATA: COPD, smoker and cough.

EXAM:
CHEST  2 VIEW

[w chest pa]
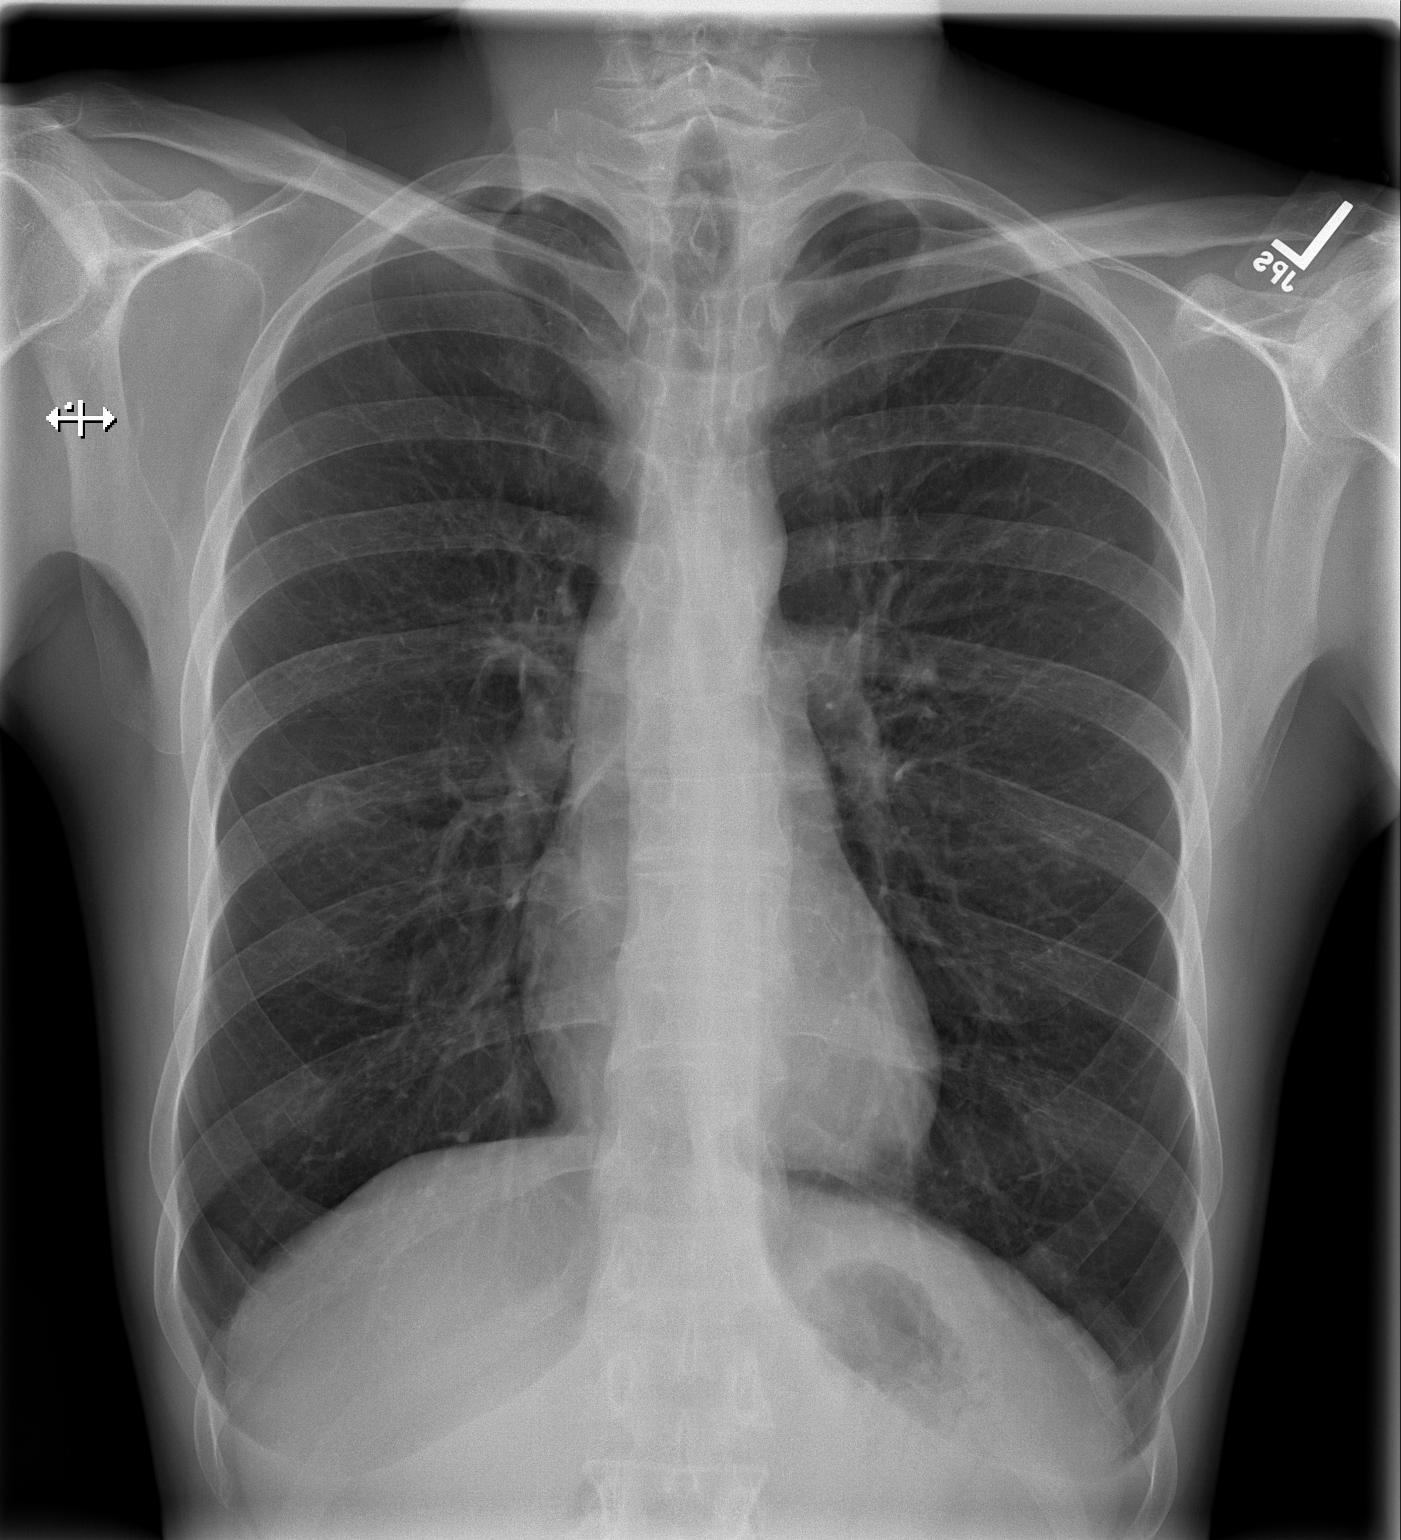

[w chest lat]
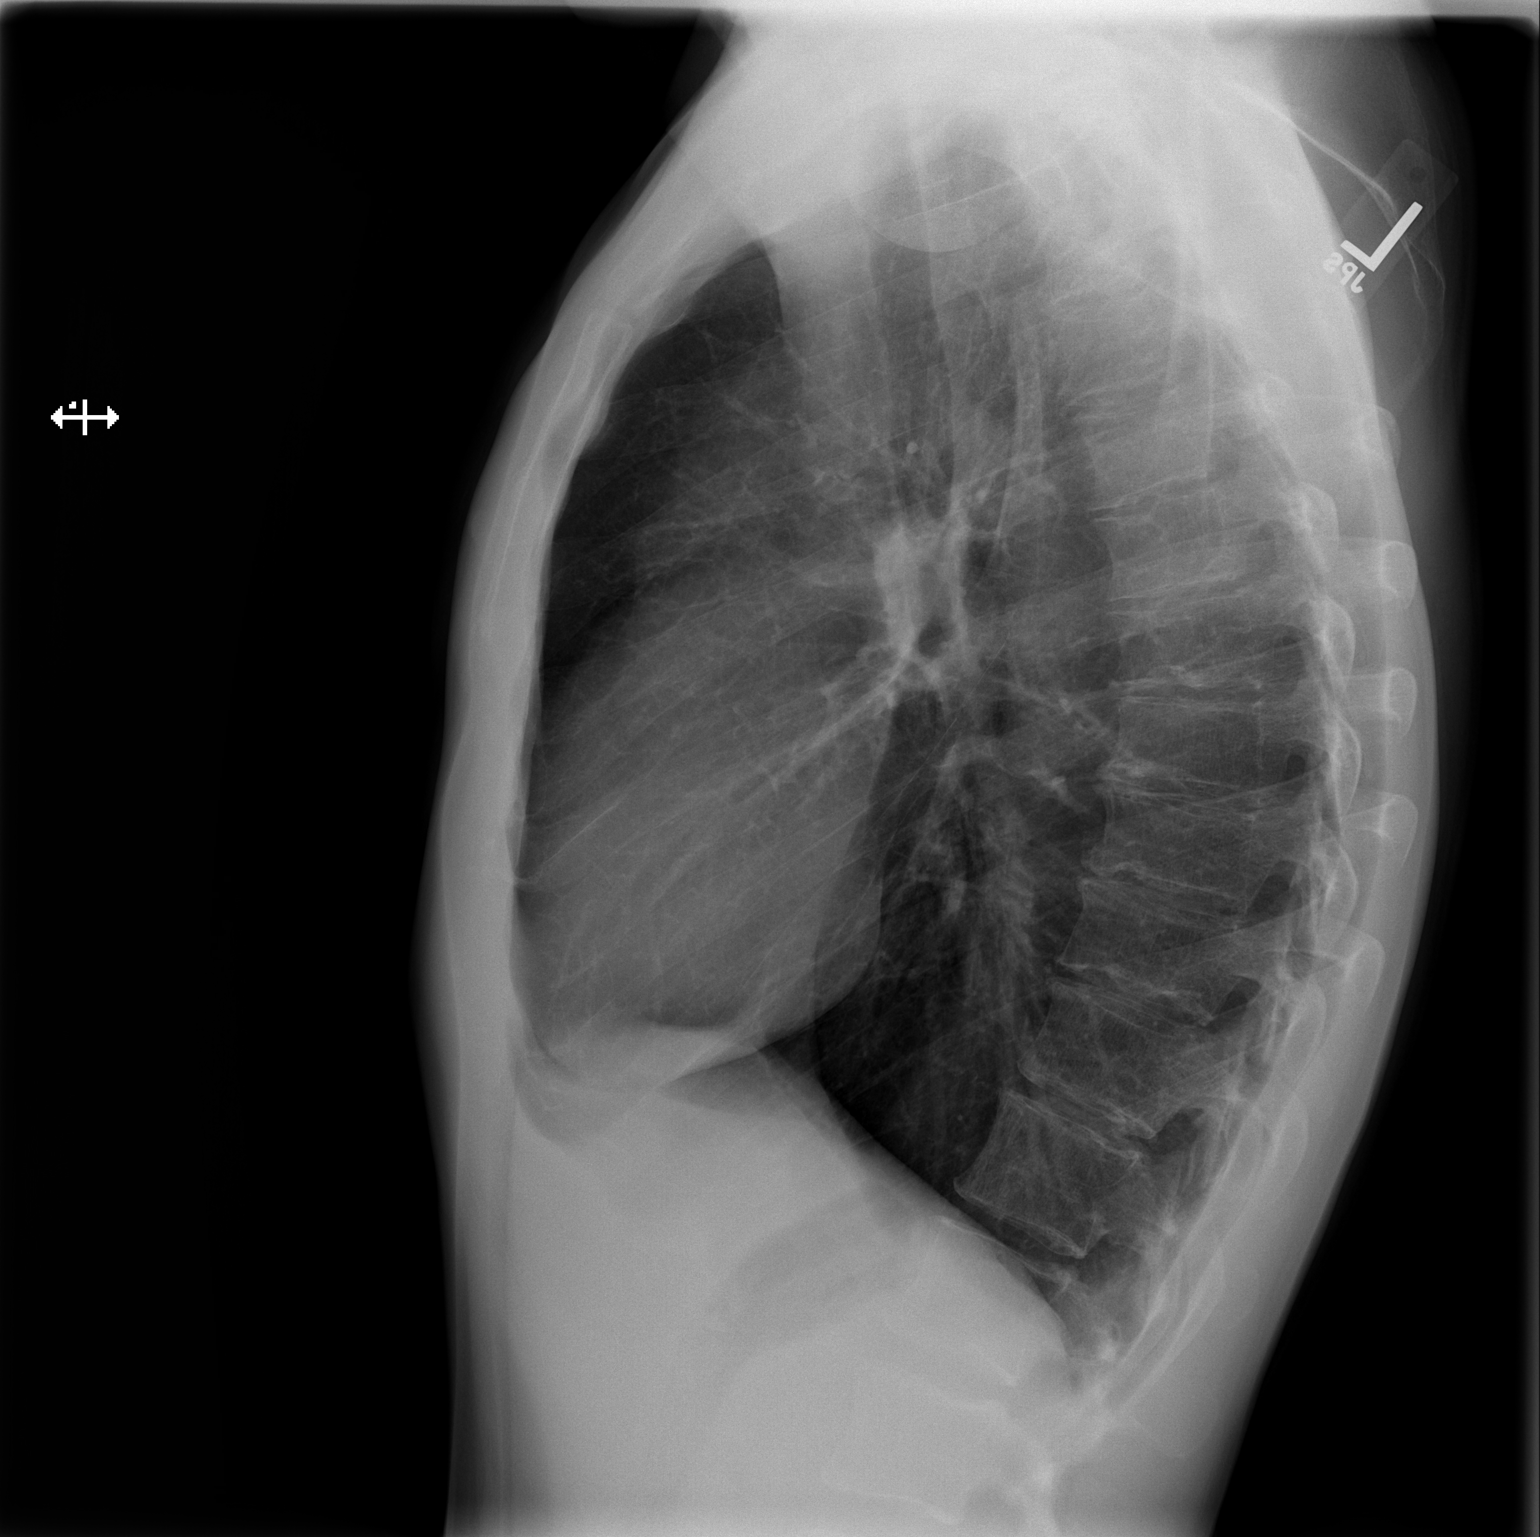

[2 of 2 positions shown; findings below may reference images not displayed]

FINDINGS: Two views demonstrate hyperinflation. Findings are consistent with
emphysematous changes. There is a nodular density in the right mid
lung which could be associated with a nipple shadow. Question a
similar finding on the left side. Otherwise, the lungs are clear.
Heart and mediastinum are within normal limits. Bony thorax is
intact.
IMPRESSION: No acute cardiopulmonary disease.

Nodular density in the right mid chest as described. Finding could
be related to a nipple shadow but recommend follow-up exam with
nipple markers to confirm.

Hyperinflation and suspect emphysematous disease.

## 2016-10-04 ENCOUNTER — Emergency Department (HOSPITAL_BASED_OUTPATIENT_CLINIC_OR_DEPARTMENT_OTHER): Payer: Self-pay

## 2016-10-04 ENCOUNTER — Emergency Department (HOSPITAL_BASED_OUTPATIENT_CLINIC_OR_DEPARTMENT_OTHER)
Admission: EM | Admit: 2016-10-04 | Discharge: 2016-10-04 | Disposition: A | Discharge status: Home / Self Care | Payer: Self-pay | Attending: Emergency Medicine | Admitting: Emergency Medicine

## 2016-10-04 ENCOUNTER — Encounter (HOSPITAL_BASED_OUTPATIENT_CLINIC_OR_DEPARTMENT_OTHER): Payer: Self-pay

## 2016-10-04 DIAGNOSIS — J441 Chronic obstructive pulmonary disease with (acute) exacerbation: Secondary | ICD-10-CM | POA: Insufficient documentation

## 2016-10-04 DIAGNOSIS — J189 Pneumonia, unspecified organism: Secondary | ICD-10-CM

## 2016-10-04 DIAGNOSIS — J45909 Unspecified asthma, uncomplicated: Secondary | ICD-10-CM | POA: Insufficient documentation

## 2016-10-04 DIAGNOSIS — F17210 Nicotine dependence, cigarettes, uncomplicated: Secondary | ICD-10-CM | POA: Insufficient documentation

## 2016-10-04 DIAGNOSIS — J181 Lobar pneumonia, unspecified organism: Secondary | ICD-10-CM | POA: Insufficient documentation

## 2016-10-04 MED ORDER — ALBUTEROL SULFATE (2.5 MG/3ML) 0.083% IN NEBU
2.5000 mg | INHALATION_SOLUTION | Freq: Once | RESPIRATORY_TRACT | Status: AC
  Administered 2016-10-04: 2.5 mg via RESPIRATORY_TRACT

## 2016-10-04 MED ORDER — DOXYCYCLINE HYCLATE 100 MG PO CAPS: 100.0000 mg | ORAL_CAPSULE | Freq: Two times a day (BID) | ORAL | 0 refills | Status: AC

## 2016-10-04 MED ORDER — IPRATROPIUM-ALBUTEROL 0.5-2.5 (3) MG/3ML IN SOLN
3.0000 mL | Freq: Once | RESPIRATORY_TRACT | Status: AC
  Administered 2016-10-04: 3 mL via RESPIRATORY_TRACT

## 2016-10-04 MED ORDER — IPRATROPIUM-ALBUTEROL 0.5-2.5 (3) MG/3ML IN SOLN
RESPIRATORY_TRACT | Status: AC
  Administered 2016-10-04: 3 mL via RESPIRATORY_TRACT
  Filled 2016-10-04: qty 3

## 2016-10-04 MED ORDER — PREDNISONE 20 MG PO TABS
40.0000 mg | ORAL_TABLET | Freq: Once | ORAL | Status: AC
Start: 2016-10-04 — End: 2016-10-04
  Administered 2016-10-04: 40 mg via ORAL
  Filled 2016-10-04: qty 2

## 2016-10-04 MED ORDER — PREDNISONE 10 MG PO TABS: 40.0000 mg | ORAL_TABLET | Freq: Every day | ORAL | 0 refills | Status: AC

## 2016-10-04 MED ORDER — ALBUTEROL SULFATE (2.5 MG/3ML) 0.083% IN NEBU
INHALATION_SOLUTION | RESPIRATORY_TRACT | Status: AC
  Administered 2016-10-04: 2.5 mg via RESPIRATORY_TRACT
  Filled 2016-10-04: qty 3

## 2016-10-04 MED ORDER — ALBUTEROL SULFATE (2.5 MG/3ML) 0.083% IN NEBU: 2.5000 mg | INHALATION_SOLUTION | Freq: Four times a day (QID) | RESPIRATORY_TRACT | 0 refills | Status: AC | PRN

## 2016-10-04 MED ORDER — ALBUTEROL SULFATE HFA 108 (90 BASE) MCG/ACT IN AERS
2.0000 | INHALATION_SPRAY | Freq: Once | RESPIRATORY_TRACT | Status: AC
  Administered 2016-10-04: 2 via RESPIRATORY_TRACT
  Filled 2016-10-04: qty 6.7

## 2016-10-04 MED FILL — DOXYCYCLINE HYC 100 MG CAP: 100 | 7 days supply | Qty: 14 | Fill #0

## 2016-10-04 MED FILL — predniSONE 10 MG TABS: 10 | 4 days supply | Qty: 16 | Fill #0

## 2016-10-04 MED FILL — ALBUTEROL 0.083% INHAL SOLN: (2.5 MG/3ML | 4 days supply | Qty: 75 | Fill #0

## 2016-10-04 NOTE — ED Triage Notes (Signed)
Pt reports 2 weeks of cough, fever, shortness of breath with progressive worsening over the past 4 days. Pt diaphoretic, wheezing, in triage. Out of all COPD meds. RT in to give neb.

## 2016-10-04 NOTE — ED Provider Notes (Signed)
[facility] Note   CSN: [id number] Arrival date & time: 10/04/16  1413   By signing my name below, I, Freida Busmaniana Omoyeni, attest that this documentation has been prepared under the direction and in the presence of Tilden FossaElizabeth Jisela Merlino, MD . Electronically Signed: Freida Busmaniana Omoyeni, Scribe. 10/04/2016. 3:14 PM.   History   Chief Complaint Chief Complaint  Patient presents with  . Shortness of Breath     The history is provided by the patient. No language interpreter was used.     HPI Comments:  Wardell HeathCarey W Martinezgarcia is a 46 y.o. male with a history of COPD and asthma, who presents to the Emergency Department complaining of persistent SOB x 2-3 days. He reports associated nausea,  fever with TMAX of 101 and dry cough but no cough today. He also notes back pain secondary to cough, one episode of vomiting yesterday and BLE soreness x~ 3 days since he has been staying in bed more due to feeling unwell. Pt is a current smoker ~ 1/2 ppd. He denies h/o PE/DVT. No alleviating factors noted; states he is out of his COPD meds.   Past Medical History:  Diagnosis Date  . Arthritis   . COPD (chronic obstructive pulmonary disease) [facility](HCC)     Patient Active Problem List   Diagnosis Date Noted  . TOBACCO ABUSE 10/11/2007  . CHRONIC OBSTRUCTIVE PULMONARY DISEASE, ACUTE EXACERBATION 10/11/2007  . ALLERGIC RHINITIS 10/10/2007  . ASTHMA 10/10/2007  . OSTEOARTHRITIS 10/10/2007    Past Surgical History:  Procedure Laterality Date  . SHOULDER ARTHROSCOPY     bone spurs and arthritis       Home Medications    Prior to Admission medications   Medication Sig Start Date End Date Taking? Authorizing Provider  doxycycline (VIBRAMYCIN) 100 MG capsule Take 1 capsule (100 mg total) by mouth 2 (two) times daily. One po bid x 7 days 03/11/15   Zadie Rhineonald Wickline, MD  naproxen sodium (ANAPROX) 220 MG tablet Take 660 mg by mouth 2 (two) times daily as needed (pain).    Historical Provider, MD     Family History History reviewed. No pertinent family history.  Social History Social History  Substance Use Topics  . Smoking status: Current Every Day Smoker    Packs/day: 0.50    Types: Cigarettes  . Smokeless tobacco: Never Used  . Alcohol use No     Comment: rarely     Allergies   Morphine and related   Review of Systems Review of Systems 10 systems reviewed and all are negative for acute change except as noted in the HPI.    Physical Exam Updated Vital Signs BP (!) 125/95   Pulse 93   Temp 98.6 F (37 C) (Oral)   Resp (!) 23   Ht 5\' 11"  (1.803 m)   Wt 140 lb (63.5 kg)   SpO2 97%   BMI 19.53 kg/m   Physical Exam  Constitutional: He is oriented to person, place, and time. He appears well-developed and well-nourished.  HENT:  Head: Normocephalic and atraumatic.  Cardiovascular: Regular rhythm.  Tachycardia present.   No murmur heard. Pulmonary/Chest: Effort normal and breath sounds normal. No respiratory distress. He has no wheezes.  Abdominal: Soft. There is no tenderness. There is no rebound and no guarding.  Musculoskeletal: He exhibits no edema or tenderness.  Neurological: He is alert and oriented to person, place, and time.  Skin: Skin is warm and dry.  Psychiatric: He has a normal mood and affect.  His behavior is normal.  Nursing note and vitals reviewed.    ED Treatments / Results  DIAGNOSTIC STUDIES:  Oxygen Saturation is 98% on RA, normal by my interpretation.    COORDINATION OF CARE:  3:08 PM Pt feels better after neb treatment. Discussed treatment plan with pt at bedside and pt agreed to plan.  Labs (all labs ordered are listed, but only abnormal results are displayed) Labs Reviewed - No data to display  EKG  EKG Interpretation  Date/Time:  Monday October 04 2016 14:43:01 EDT Ventricular Rate:  94 PR Interval:    QRS Duration: 83 QT Interval:  360 QTC Calculation: 451 R Axis:   83 Text Interpretation:  Sinus rhythm Right  atrial enlargement ST elev, probable normal early repol pattern agree. no STEMI. no sig change from old Confirmed by Donnald Garre, MD, Lebron Conners [phone]) on 10/04/2016 2:45:52 PM       Radiology Dg Chest 2 View  Result Date: 10/04/2016 CLINICAL DATA:  Cough and fever for 2 weeks.  Shortness of Breath EXAM: CHEST  2 VIEW COMPARISON:  September 12, 2014 FINDINGS: There is subtle ill-defined opacity in the right upper lobe anteriorly, concerning for focal pneumonia. Lungs elsewhere are clear. Heart size and pulmonary vascularity are normal. No adenopathy. No bone lesions peer IMPRESSION: Subtle infiltrate anterior segment right upper lobe. Suspect focal pneumonia. Lungs elsewhere clear. Stable cardiac silhouette. Electronically Signed   By: Bretta Bang III M.D.   On: 10/04/2016 14:55    Procedures Procedures (including critical care time)  Medications Ordered in ED Medications  predniSONE (DELTASONE) tablet 40 mg (not administered)  albuterol (PROVENTIL HFA;VENTOLIN HFA) 108 (90 Base) MCG/ACT inhaler 2 puff (not administered)  ipratropium-albuterol (DUONEB) 0.5-2.5 (3) MG/3ML nebulizer solution 3 mL (3 mLs Nebulization Given 10/04/16 1428)  albuterol (PROVENTIL) (2.5 MG/3ML) 0.083% nebulizer solution 2.5 mg (2.5 mg Nebulization Given 10/04/16 1428)     Initial Impression / Assessment and Plan / ED Course  I have reviewed the triage vital signs and the nursing notes.  Pertinent labs & imaging results that were available during my care of the patient were reviewed by me and considered in my medical decision making (see chart for details).    Nursing notes reviewed; pt wheezing upon arrival to ED  but received albuterol prior to EDP evaluation. Symptoms significantly improved upon exam.   Patient with history of COPD and ongoing tobacco use here with increased shortness of breath and fevers at home. Patient is nontoxic on exam with no respiratory distress. His shortness of breath resolved following  albuterol treatment in the department. Current clinical picture is not consistent with CHF, PE, ACS. Counseled patient on and home care for COPD with pneumonia. Discussed outpatient follow-up and smoking cessation. Return precautions discussed.  Final Clinical Impressions(s) / ED Diagnoses   Final diagnoses:  COPD exacerbation (HCC)  Community acquired pneumonia of right upper lobe of lung (HCC)    New Prescriptions New Prescriptions   No medications on file    I personally performed the services described in this documentation, which was scribed in my presence. The recorded information has been reviewed and is accurate.     Tilden Fossa, MD 10/04/16 [phone]

## 2019-11-18 ENCOUNTER — Inpatient Hospital Stay (HOSPITAL_COMMUNITY): Payer: Self-pay

## 2019-11-18 ENCOUNTER — Emergency Department (HOSPITAL_COMMUNITY): Payer: Self-pay

## 2019-11-18 ENCOUNTER — Inpatient Hospital Stay (HOSPITAL_COMMUNITY)
Admission: EM | Admit: 2019-11-18 | Discharge: 2019-12-18 | DRG: 100 | Disposition: E | Payer: Self-pay | Attending: Internal Medicine | Admitting: Internal Medicine

## 2019-11-18 DIAGNOSIS — I469 Cardiac arrest, cause unspecified: Principal | ICD-10-CM | POA: Diagnosis present

## 2019-11-18 DIAGNOSIS — R0902 Hypoxemia: Secondary | ICD-10-CM

## 2019-11-18 DIAGNOSIS — Z0189 Encounter for other specified special examinations: Secondary | ICD-10-CM

## 2019-11-18 DIAGNOSIS — T17908A Unspecified foreign body in respiratory tract, part unspecified causing other injury, initial encounter: Secondary | ICD-10-CM

## 2019-11-18 DIAGNOSIS — N19 Unspecified kidney failure: Secondary | ICD-10-CM

## 2019-11-18 DIAGNOSIS — Z9289 Personal history of other medical treatment: Secondary | ICD-10-CM

## 2019-11-18 DIAGNOSIS — Z452 Encounter for adjustment and management of vascular access device: Secondary | ICD-10-CM

## 2019-11-18 DIAGNOSIS — F1910 Other psychoactive substance abuse, uncomplicated: Secondary | ICD-10-CM

## 2019-11-18 DIAGNOSIS — K922 Gastrointestinal hemorrhage, unspecified: Secondary | ICD-10-CM

## 2019-11-18 DIAGNOSIS — D65 Disseminated intravascular coagulation [defibrination syndrome]: Secondary | ICD-10-CM | POA: Diagnosis not present

## 2019-11-18 DIAGNOSIS — N179 Acute kidney failure, unspecified: Secondary | ICD-10-CM | POA: Diagnosis present

## 2019-11-18 DIAGNOSIS — Y92481 Parking lot as the place of occurrence of the external cause: Secondary | ICD-10-CM

## 2019-11-18 DIAGNOSIS — R739 Hyperglycemia, unspecified: Secondary | ICD-10-CM | POA: Diagnosis not present

## 2019-11-18 DIAGNOSIS — D696 Thrombocytopenia, unspecified: Secondary | ICD-10-CM

## 2019-11-18 DIAGNOSIS — R578 Other shock: Secondary | ICD-10-CM | POA: Diagnosis present

## 2019-11-18 DIAGNOSIS — J690 Pneumonitis due to inhalation of food and vomit: Secondary | ICD-10-CM | POA: Diagnosis present

## 2019-11-18 DIAGNOSIS — F419 Anxiety disorder, unspecified: Secondary | ICD-10-CM | POA: Diagnosis present

## 2019-11-18 DIAGNOSIS — E162 Hypoglycemia, unspecified: Secondary | ICD-10-CM | POA: Diagnosis present

## 2019-11-18 DIAGNOSIS — Z20822 Contact with and (suspected) exposure to covid-19: Secondary | ICD-10-CM | POA: Diagnosis present

## 2019-11-18 DIAGNOSIS — F17200 Nicotine dependence, unspecified, uncomplicated: Secondary | ICD-10-CM | POA: Diagnosis present

## 2019-11-18 DIAGNOSIS — Z791 Long term (current) use of non-steroidal anti-inflammatories (NSAID): Secondary | ICD-10-CM

## 2019-11-18 DIAGNOSIS — I468 Cardiac arrest due to other underlying condition: Secondary | ICD-10-CM | POA: Diagnosis present

## 2019-11-18 DIAGNOSIS — G931 Anoxic brain damage, not elsewhere classified: Secondary | ICD-10-CM | POA: Diagnosis present

## 2019-11-18 DIAGNOSIS — R188 Other ascites: Secondary | ICD-10-CM | POA: Diagnosis present

## 2019-11-18 DIAGNOSIS — F1210 Cannabis abuse, uncomplicated: Secondary | ICD-10-CM | POA: Diagnosis present

## 2019-11-18 DIAGNOSIS — Z66 Do not resuscitate: Secondary | ICD-10-CM | POA: Diagnosis not present

## 2019-11-18 DIAGNOSIS — E874 Mixed disorder of acid-base balance: Secondary | ICD-10-CM | POA: Diagnosis present

## 2019-11-18 DIAGNOSIS — F329 Major depressive disorder, single episode, unspecified: Secondary | ICD-10-CM | POA: Diagnosis present

## 2019-11-18 DIAGNOSIS — J9690 Respiratory failure, unspecified, unspecified whether with hypoxia or hypercapnia: Secondary | ICD-10-CM

## 2019-11-18 DIAGNOSIS — K7200 Acute and subacute hepatic failure without coma: Secondary | ICD-10-CM | POA: Diagnosis present

## 2019-11-18 DIAGNOSIS — J9601 Acute respiratory failure with hypoxia: Secondary | ICD-10-CM | POA: Diagnosis present

## 2019-11-18 DIAGNOSIS — E43 Unspecified severe protein-calorie malnutrition: Secondary | ICD-10-CM | POA: Insufficient documentation

## 2019-11-18 DIAGNOSIS — D649 Anemia, unspecified: Secondary | ICD-10-CM | POA: Diagnosis not present

## 2019-11-18 DIAGNOSIS — F1410 Cocaine abuse, uncomplicated: Secondary | ICD-10-CM | POA: Diagnosis present

## 2019-11-18 DIAGNOSIS — G40509 Epileptic seizures related to external causes, not intractable, without status epilepticus: Principal | ICD-10-CM | POA: Diagnosis present

## 2019-11-18 DIAGNOSIS — R34 Anuria and oliguria: Secondary | ICD-10-CM | POA: Diagnosis not present

## 2019-11-18 DIAGNOSIS — Z79899 Other long term (current) drug therapy: Secondary | ICD-10-CM

## 2019-11-18 DIAGNOSIS — T65891A Toxic effect of other specified substances, accidental (unintentional), initial encounter: Secondary | ICD-10-CM | POA: Diagnosis present

## 2019-11-18 DIAGNOSIS — R748 Abnormal levels of other serum enzymes: Secondary | ICD-10-CM

## 2019-11-18 DIAGNOSIS — J449 Chronic obstructive pulmonary disease, unspecified: Secondary | ICD-10-CM | POA: Diagnosis present

## 2019-11-18 DIAGNOSIS — E875 Hyperkalemia: Secondary | ICD-10-CM | POA: Diagnosis not present

## 2019-11-18 DIAGNOSIS — M6282 Rhabdomyolysis: Secondary | ICD-10-CM | POA: Diagnosis present

## 2019-11-18 DIAGNOSIS — Z885 Allergy status to narcotic agent status: Secondary | ICD-10-CM

## 2019-11-18 DIAGNOSIS — Z515 Encounter for palliative care: Secondary | ICD-10-CM | POA: Diagnosis not present

## 2019-11-18 LAB — POCT I-STAT 7, (LYTES, BLD GAS, ICA,H+H)
Acid-base deficit: 10 mmol/L — ABNORMAL HIGH (ref 0.0–2.0)
Acid-base deficit: 17 mmol/L — ABNORMAL HIGH (ref 0.0–2.0)
Acid-base deficit: 23 mmol/L — ABNORMAL HIGH (ref 0.0–2.0)
Acid-base deficit: 3 mmol/L — ABNORMAL HIGH (ref 0.0–2.0)
Acid-base deficit: 30 mmol/L — ABNORMAL HIGH (ref 0.0–2.0)
Acid-base deficit: 7 mmol/L — ABNORMAL HIGH (ref 0.0–2.0)
Bicarbonate: 13.7 mmol/L — ABNORMAL LOW (ref 20.0–28.0)
Bicarbonate: 20.4 mmol/L (ref 20.0–28.0)
Bicarbonate: 21.1 mmol/L (ref 20.0–28.0)
Bicarbonate: 23.1 mmol/L (ref 20.0–28.0)
Bicarbonate: 5.5 mmol/L — ABNORMAL LOW (ref 20.0–28.0)
Bicarbonate: 9.4 mmol/L — ABNORMAL LOW (ref 20.0–28.0)
Calcium, Ion: 0.98 mmol/L — ABNORMAL LOW (ref 1.15–1.40)
Calcium, Ion: 0.98 mmol/L — ABNORMAL LOW (ref 1.15–1.40)
Calcium, Ion: 1.03 mmol/L — ABNORMAL LOW (ref 1.15–1.40)
Calcium, Ion: 1.03 mmol/L — ABNORMAL LOW (ref 1.15–1.40)
Calcium, Ion: 1.13 mmol/L — ABNORMAL LOW (ref 1.15–1.40)
Calcium, Ion: 1.13 mmol/L — ABNORMAL LOW (ref 1.15–1.40)
HCT: 33 % — ABNORMAL LOW (ref 39.0–52.0)
HCT: 35 % — ABNORMAL LOW (ref 39.0–52.0)
HCT: 36 % — ABNORMAL LOW (ref 39.0–52.0)
HCT: 37 % — ABNORMAL LOW (ref 39.0–52.0)
HCT: 38 % — ABNORMAL LOW (ref 39.0–52.0)
HCT: 39 % (ref 39.0–52.0)
Hemoglobin: 11.2 g/dL — ABNORMAL LOW (ref 13.0–17.0)
Hemoglobin: 11.9 g/dL — ABNORMAL LOW (ref 13.0–17.0)
Hemoglobin: 12.2 g/dL — ABNORMAL LOW (ref 13.0–17.0)
Hemoglobin: 12.6 g/dL — ABNORMAL LOW (ref 13.0–17.0)
Hemoglobin: 12.9 g/dL — ABNORMAL LOW (ref 13.0–17.0)
Hemoglobin: 13.3 g/dL (ref 13.0–17.0)
O2 Saturation: 100 %
O2 Saturation: 100 %
O2 Saturation: 91 %
O2 Saturation: 92 %
O2 Saturation: 99 %
O2 Saturation: 99 %
Patient temperature: 33
Patient temperature: 33
Patient temperature: 33.4
Patient temperature: 33.6
Patient temperature: 87.8
Patient temperature: 88.7
Potassium: 3.4 mmol/L — ABNORMAL LOW (ref 3.5–5.1)
Potassium: 4.2 mmol/L (ref 3.5–5.1)
Potassium: 4.6 mmol/L (ref 3.5–5.1)
Potassium: 4.8 mmol/L (ref 3.5–5.1)
Potassium: 5.2 mmol/L — ABNORMAL HIGH (ref 3.5–5.1)
Potassium: 5.5 mmol/L — ABNORMAL HIGH (ref 3.5–5.1)
Sodium: 138 mmol/L (ref 135–145)
Sodium: 138 mmol/L (ref 135–145)
Sodium: 139 mmol/L (ref 135–145)
Sodium: 139 mmol/L (ref 135–145)
Sodium: 139 mmol/L (ref 135–145)
Sodium: 141 mmol/L (ref 135–145)
TCO2: 11 mmol/L — ABNORMAL LOW (ref 22–32)
TCO2: 15 mmol/L — ABNORMAL LOW (ref 22–32)
TCO2: 22 mmol/L (ref 22–32)
TCO2: 23 mmol/L (ref 22–32)
TCO2: 24 mmol/L (ref 22–32)
TCO2: 8 mmol/L — ABNORMAL LOW (ref 22–32)
pCO2 arterial: 36.7 mmHg (ref 32.0–48.0)
pCO2 arterial: 39.2 mmHg (ref 32.0–48.0)
pCO2 arterial: 43.7 mmHg (ref 32.0–48.0)
pCO2 arterial: 45.6 mmHg (ref 32.0–48.0)
pCO2 arterial: 52.8 mmHg — ABNORMAL HIGH (ref 32.0–48.0)
pCO2 arterial: 54 mmHg — ABNORMAL HIGH (ref 32.0–48.0)
pH, Arterial: 6.567 — CL (ref 7.350–7.450)
pH, Arterial: 6.977 — CL (ref 7.350–7.450)
pH, Arterial: 7.082 — CL (ref 7.350–7.450)
pH, Arterial: 7.16 — CL (ref 7.350–7.450)
pH, Arterial: 7.25 — ABNORMAL LOW (ref 7.350–7.450)
pH, Arterial: 7.362 (ref 7.350–7.450)
pO2, Arterial: 122 mmHg — ABNORMAL HIGH (ref 83.0–108.0)
pO2, Arterial: 215 mmHg — ABNORMAL HIGH (ref 83.0–108.0)
pO2, Arterial: 303 mmHg — ABNORMAL HIGH (ref 83.0–108.0)
pO2, Arterial: 329 mmHg — ABNORMAL HIGH (ref 83.0–108.0)
pO2, Arterial: 71 mmHg — ABNORMAL LOW (ref 83.0–108.0)
pO2, Arterial: 74 mmHg — ABNORMAL LOW (ref 83.0–108.0)

## 2019-11-18 LAB — URINALYSIS, ROUTINE W REFLEX MICROSCOPIC
Bilirubin Urine: NEGATIVE
Glucose, UA: >=500 mg/dL — AB
Ketones, ur: NEGATIVE mg/dL
Leukocytes,Ua: NEGATIVE
Nitrite: NEGATIVE
Protein, ur: 100 mg/dL — AB
Specific Gravity, Urine: 1.016 (ref 1.005–1.030)
pH: 5 (ref 5.0–8.0)

## 2019-11-18 LAB — CBC
HCT: 45.9 % (ref 39.0–52.0)
Hemoglobin: 13.7 g/dL (ref 13.0–17.0)
MCH: 32.4 pg (ref 26.0–34.0)
MCHC: 29.8 g/dL — ABNORMAL LOW (ref 30.0–36.0)
MCV: 108.5 fL — ABNORMAL HIGH (ref 80.0–100.0)
Platelets: 160 10*3/uL (ref 150–400)
RBC: 4.23 MIL/uL (ref 4.22–5.81)
RDW: 12.4 % (ref 11.5–15.5)
WBC: 17.9 10*3/uL — ABNORMAL HIGH (ref 4.0–10.5)
nRBC: 0 % (ref 0.0–0.2)

## 2019-11-18 LAB — HEMOGLOBIN A1C
Hgb A1c MFr Bld: 5.3 % (ref 4.8–5.6)
Mean Plasma Glucose: 105.41 mg/dL

## 2019-11-18 LAB — ETHANOL: Alcohol, Ethyl (B): <10 mg/dL (ref <10)

## 2019-11-18 LAB — GLUCOSE, CAPILLARY
Glucose-Capillary: 171 mg/dL — ABNORMAL HIGH (ref 70–99)
Glucose-Capillary: 193 mg/dL — ABNORMAL HIGH (ref 70–99)
Glucose-Capillary: 202 mg/dL — ABNORMAL HIGH (ref 70–99)
Glucose-Capillary: 217 mg/dL — ABNORMAL HIGH (ref 70–99)
Glucose-Capillary: 228 mg/dL — ABNORMAL HIGH (ref 70–99)
Glucose-Capillary: 233 mg/dL — ABNORMAL HIGH (ref 70–99)
Glucose-Capillary: 236 mg/dL — ABNORMAL HIGH (ref 70–99)
Glucose-Capillary: 237 mg/dL — ABNORMAL HIGH (ref 70–99)
Glucose-Capillary: 253 mg/dL — ABNORMAL HIGH (ref 70–99)
Glucose-Capillary: 262 mg/dL — ABNORMAL HIGH (ref 70–99)
Glucose-Capillary: 266 mg/dL — ABNORMAL HIGH (ref 70–99)

## 2019-11-18 LAB — APTT
aPTT: 96 seconds — ABNORMAL HIGH (ref 24–36)
aPTT: 50 seconds — ABNORMAL HIGH (ref 24–36)
aPTT: 49 seconds — ABNORMAL HIGH (ref 24–36)

## 2019-11-18 LAB — PROTIME-INR
INR: 2.9 — ABNORMAL HIGH (ref 0.8–1.2)
INR: 1.9 — ABNORMAL HIGH (ref 0.8–1.2)
INR: 2 — ABNORMAL HIGH (ref 0.8–1.2)
Prothrombin Time: 29 seconds — ABNORMAL HIGH (ref 11.4–15.2)
Prothrombin Time: 21.5 seconds — ABNORMAL HIGH (ref 11.4–15.2)
Prothrombin Time: 21.8 seconds — ABNORMAL HIGH (ref 11.4–15.2)

## 2019-11-18 LAB — LACTIC ACID, PLASMA
Lactic Acid, Venous: >11 mmol/L (ref 0.5–1.9)
Lactic Acid, Venous: 7.5 mmol/L (ref 0.5–1.9)

## 2019-11-18 LAB — I-STAT CHEM 8, ED
BUN: 38 mg/dL — ABNORMAL HIGH (ref 8–23)
Calcium, Ion: 1.05 mmol/L — ABNORMAL LOW (ref 1.15–1.40)
Chloride: 112 mmol/L — ABNORMAL HIGH (ref 98–111)
Creatinine, Ser: 3 mg/dL — ABNORMAL HIGH (ref 0.61–1.24)
Glucose, Bld: 300 mg/dL — ABNORMAL HIGH (ref 70–99)
HCT: 33 % — ABNORMAL LOW (ref 39.0–52.0)
Hemoglobin: 11.2 g/dL — ABNORMAL LOW (ref 13.0–17.0)
Potassium: 4.9 mmol/L (ref 3.5–5.1)
Sodium: 140 mmol/L (ref 135–145)
TCO2: 8 mmol/L — ABNORMAL LOW (ref 22–32)

## 2019-11-18 LAB — RAPID URINE DRUG SCREEN, HOSP PERFORMED
Amphetamines: POSITIVE — AB
Barbiturates: NOT DETECTED
Benzodiazepines: NOT DETECTED
Cocaine: POSITIVE — AB
Opiates: NOT DETECTED
Tetrahydrocannabinol: POSITIVE — AB

## 2019-11-18 LAB — MRSA PCR SCREENING: MRSA by PCR: NEGATIVE

## 2019-11-18 LAB — TROPONIN I (HIGH SENSITIVITY)
Troponin I (High Sensitivity): 2811 ng/L (ref <18)
Troponin I (High Sensitivity): 3987 ng/L (ref <18)
Troponin I (High Sensitivity): 4712 ng/L (ref <18)

## 2019-11-18 LAB — PROCALCITONIN: Procalcitonin: 52.51 ng/mL

## 2019-11-18 LAB — CBG MONITORING, ED
Glucose-Capillary: 233 mg/dL — ABNORMAL HIGH (ref 70–99)
Glucose-Capillary: 48 mg/dL — ABNORMAL LOW (ref 70–99)

## 2019-11-18 LAB — CK: Total CK: 9230 U/L — ABNORMAL HIGH (ref 49–397)

## 2019-11-18 LAB — OSMOLALITY: Osmolality: 317 mOsm/kg — ABNORMAL HIGH (ref 275–295)

## 2019-11-18 MED ORDER — HEPARIN SODIUM (PORCINE) 5000 UNIT/ML IJ SOLN
5000.0000 [IU] | Freq: Three times a day (TID) | INTRAMUSCULAR | Status: DC
  Administered 2019-11-18 – 2019-11-23 (×15): 5000 [IU] via SUBCUTANEOUS
  Filled 2019-11-18 (×15): qty 1

## 2019-11-18 MED ORDER — MIDAZOLAM HCL 2 MG/2ML IJ SOLN
INTRAMUSCULAR | Status: AC
  Filled 2019-11-18: qty 2

## 2019-11-18 MED ORDER — SODIUM CHLORIDE 0.9 % IV SOLN
1.0000 g | Freq: Once | INTRAVENOUS | Status: AC
  Administered 2019-11-18: 1 g via INTRAVENOUS
  Filled 2019-11-18: qty 10

## 2019-11-18 MED ORDER — SODIUM CHLORIDE 0.9 % IV SOLN
2.0000 g | INTRAVENOUS | Status: DC
  Administered 2019-11-19 – 2019-11-20 (×2): 2 g via INTRAVENOUS
  Filled 2019-11-18 (×3): qty 2

## 2019-11-18 MED ORDER — SODIUM CHLORIDE 0.9 % IV SOLN: INTRAVENOUS | Status: DC

## 2019-11-18 MED ORDER — CHLORHEXIDINE GLUCONATE 0.12% ORAL RINSE (MEDLINE KIT)
15.0000 mL | Freq: Two times a day (BID) | OROMUCOSAL | Status: DC
  Administered 2019-11-18 – 2019-11-24 (×12): 15 mL via OROMUCOSAL

## 2019-11-18 MED ORDER — SODIUM BICARBONATE 8.4 % IV SOLN
INTRAVENOUS | Status: AC
  Administered 2019-11-18: 50 meq
  Filled 2019-11-18: qty 150

## 2019-11-18 MED ORDER — INSULIN REGULAR(HUMAN) IN NACL 100-0.9 UT/100ML-% IV SOLN
INTRAVENOUS | Status: DC
  Administered 2019-11-18: 2.6 [IU]/h via INTRAVENOUS
  Filled 2019-11-18 (×2): qty 100

## 2019-11-18 MED ORDER — DEXTROSE 50 % IV SOLN
INTRAVENOUS | Status: AC
  Administered 2019-11-18: 50 mL via INTRAVENOUS
  Filled 2019-11-18: qty 50

## 2019-11-18 MED ORDER — SODIUM CHLORIDE 0.9 % IV BOLUS
1000.0000 mL | Freq: Once | INTRAVENOUS | Status: AC
  Administered 2019-11-18: 1000 mL via INTRAVENOUS

## 2019-11-18 MED ORDER — NOREPINEPHRINE 4 MG/250ML-% IV SOLN
INTRAVENOUS | Status: AC
  Filled 2019-11-18: qty 250

## 2019-11-18 MED ORDER — DEXTROSE 50 % IV SOLN
0.0000 mL | INTRAVENOUS | Status: DC | PRN
  Administered 2019-11-19: 30 mL via INTRAVENOUS
  Filled 2019-11-18: qty 50

## 2019-11-18 MED ORDER — NALOXONE HCL 2 MG/2ML IJ SOSY
2.0000 mg | PREFILLED_SYRINGE | INTRAMUSCULAR | Status: DC | PRN
  Administered 2019-11-18: 2 mg via INTRAVENOUS
  Filled 2019-11-18: qty 2

## 2019-11-18 MED ORDER — PANTOPRAZOLE SODIUM 40 MG IV SOLR
40.0000 mg | Freq: Every day | INTRAVENOUS | Status: DC
  Administered 2019-11-18: 40 mg via INTRAVENOUS
  Filled 2019-11-18: qty 40

## 2019-11-18 MED ORDER — DEXTROSE-NACL 5-0.45 % IV SOLN: INTRAVENOUS | Status: DC

## 2019-11-18 MED ORDER — ASPIRIN 300 MG RE SUPP
300.0000 mg | RECTAL | Status: AC
  Administered 2019-11-18: 300 mg via RECTAL
  Filled 2019-11-18: qty 1

## 2019-11-18 MED ORDER — FENTANYL CITRATE (PF) 100 MCG/2ML IJ SOLN
100.0000 ug | Freq: Once | INTRAMUSCULAR | Status: DC
  Filled 2019-11-18: qty 2

## 2019-11-18 MED ORDER — THIAMINE HCL 100 MG PO TABS
100.0000 mg | ORAL_TABLET | Freq: Every day | ORAL | Status: DC
  Administered 2019-11-18 – 2019-11-23 (×6): 100 mg via ORAL
  Filled 2019-11-18 (×7): qty 1

## 2019-11-18 MED ORDER — HYDROCORTISONE NA SUCCINATE PF 100 MG IJ SOLR
50.0000 mg | Freq: Four times a day (QID) | INTRAMUSCULAR | Status: DC
  Administered 2019-11-18 – 2019-11-22 (×16): 50 mg via INTRAVENOUS
  Filled 2019-11-18 (×16): qty 2

## 2019-11-18 MED ORDER — FENTANYL CITRATE (PF) 100 MCG/2ML IJ SOLN
INTRAMUSCULAR | Status: AC
  Filled 2019-11-18: qty 2

## 2019-11-18 MED ORDER — PROPOFOL 10 MG/ML IV BOLUS
INTRAVENOUS | Status: AC
  Filled 2019-11-18: qty 20

## 2019-11-18 MED ORDER — FENTANYL 2500MCG IN NS 250ML (10MCG/ML) PREMIX INFUSION
100.0000 ug/h | INTRAVENOUS | Status: DC
  Administered 2019-11-18: 100 ug/h via INTRAVENOUS
  Administered 2019-11-19: 175 ug/h via INTRAVENOUS
  Filled 2019-11-18 (×3): qty 250

## 2019-11-18 MED ORDER — VITAL HIGH PROTEIN PO LIQD
1000.0000 mL | ORAL | Status: DC
  Administered 2019-11-18 – 2019-11-21 (×12): 1000 mL

## 2019-11-18 MED ORDER — EPINEPHRINE HCL 5 MG/250ML IV SOLN IN NS
0.5000 ug/min | INTRAVENOUS | Status: DC
  Administered 2019-11-18: 0.5 ug/min via INTRAVENOUS
  Filled 2019-11-18: qty 250

## 2019-11-18 MED ORDER — SODIUM CHLORIDE 0.9 % IV SOLN
INTRAVENOUS | Status: DC
Start: 2019-11-18 — End: 2019-11-18

## 2019-11-18 MED ORDER — DEXTROSE 50 % IV SOLN
0.0000 mL | INTRAVENOUS | Status: DC | PRN
Start: 2019-11-18 — End: 2019-11-18

## 2019-11-18 MED ORDER — ORAL CARE MOUTH RINSE
15.0000 mL | OROMUCOSAL | Status: DC
  Administered 2019-11-18 – 2019-11-24 (×60): 15 mL via OROMUCOSAL

## 2019-11-18 MED ORDER — FOLIC ACID 1 MG PO TABS
1.0000 mg | ORAL_TABLET | Freq: Every day | ORAL | Status: DC
  Administered 2019-11-18 – 2019-11-23 (×6): 1 mg via ORAL
  Filled 2019-11-18 (×7): qty 1

## 2019-11-18 MED ORDER — NOREPINEPHRINE 4 MG/250ML-% IV SOLN
0.0000 ug/min | INTRAVENOUS | Status: DC
  Administered 2019-11-18: 10 ug/min via INTRAVENOUS
  Administered 2019-11-18: 14 ug/min via INTRAVENOUS
  Administered 2019-11-18: 12 ug/min via INTRAVENOUS
  Administered 2019-11-18: 5 ug/min via INTRAVENOUS
  Administered 2019-11-19: 24 ug/min via INTRAVENOUS
  Administered 2019-11-19: 13 ug/min via INTRAVENOUS
  Filled 2019-11-18 (×4): qty 250

## 2019-11-18 MED ORDER — VANCOMYCIN HCL 1250 MG/250ML IV SOLN
1250.0000 mg | Freq: Once | INTRAVENOUS | Status: AC
  Administered 2019-11-18: 1250 mg via INTRAVENOUS
  Filled 2019-11-18: qty 250

## 2019-11-18 MED ORDER — FENTANYL BOLUS VIA INFUSION
50.0000 ug | INTRAVENOUS | Status: DC | PRN
  Filled 2019-11-18: qty 50

## 2019-11-18 MED ORDER — SODIUM BICARBONATE-DEXTROSE 150-5 MEQ/L-% IV SOLN
150.0000 meq | INTRAVENOUS | Status: DC
  Administered 2019-11-18 – 2019-11-19 (×3): 150 meq via INTRAVENOUS
  Filled 2019-11-18 (×5): qty 1000

## 2019-11-18 MED ORDER — PROPOFOL 1000 MG/100ML IV EMUL
25.0000 ug/kg/min | INTRAVENOUS | Status: DC
  Administered 2019-11-18: 25 ug/kg/min via INTRAVENOUS
  Administered 2019-11-18: 30 ug/kg/min via INTRAVENOUS
  Administered 2019-11-19: 60 ug/kg/min via INTRAVENOUS
  Administered 2019-11-19: 50 ug/kg/min via INTRAVENOUS
  Administered 2019-11-19: 30 ug/kg/min via INTRAVENOUS
  Administered 2019-11-19: 60 ug/kg/min via INTRAVENOUS
  Administered 2019-11-20: 25 ug/kg/min via INTRAVENOUS
  Filled 2019-11-18: qty 100
  Filled 2019-11-18: qty 200
  Filled 2019-11-18 (×5): qty 100

## 2019-11-18 MED ORDER — INSULIN ASPART 100 UNIT/ML ~~LOC~~ SOLN
0.0000 [IU] | SUBCUTANEOUS | Status: DC
  Administered 2019-11-18: 3 [IU] via SUBCUTANEOUS

## 2019-11-18 MED ORDER — CHLORHEXIDINE GLUCONATE CLOTH 2 % EX PADS
6.0000 | MEDICATED_PAD | Freq: Every day | CUTANEOUS | Status: DC
  Administered 2019-11-19 – 2019-11-24 (×7): 6 via TOPICAL

## 2019-11-18 MED ORDER — ASPIRIN 300 MG RE SUPP: 300.0000 mg | RECTAL | Status: DC

## 2019-11-18 MED ORDER — ARTIFICIAL TEARS OPHTHALMIC OINT
1.0000 "application " | TOPICAL_OINTMENT | Freq: Three times a day (TID) | OPHTHALMIC | Status: DC
  Administered 2019-11-18 – 2019-11-21 (×9): 1 via OPHTHALMIC
  Filled 2019-11-18 (×2): qty 3.5

## 2019-11-18 MED ORDER — SODIUM BICARBONATE 8.4 % IV SOLN
100.0000 meq | Freq: Once | INTRAVENOUS | Status: AC
  Administered 2019-11-18: 100 meq via INTRAVENOUS
  Filled 2019-11-18: qty 50

## 2019-11-18 MED ORDER — VANCOMYCIN HCL 750 MG/150ML IV SOLN
750.0000 mg | INTRAVENOUS | Status: DC
  Administered 2019-11-19: 750 mg via INTRAVENOUS
  Filled 2019-11-18 (×2): qty 150

## 2019-11-18 MED ORDER — INSULIN REGULAR(HUMAN) IN NACL 100-0.9 UT/100ML-% IV SOLN
INTRAVENOUS | Status: DC
Start: 2019-11-18 — End: 2019-11-18

## 2019-11-18 MED ORDER — SODIUM CHLORIDE 0.9 % IV SOLN
1.0000 g | Freq: Once | INTRAVENOUS | Status: AC
  Administered 2019-11-18: 1 g via INTRAVENOUS
  Filled 2019-11-18: qty 1

## 2019-11-18 MED ORDER — IPRATROPIUM-ALBUTEROL 0.5-2.5 (3) MG/3ML IN SOLN
3.0000 mL | Freq: Four times a day (QID) | RESPIRATORY_TRACT | Status: DC
  Administered 2019-11-18 – 2019-11-22 (×15): 3 mL via RESPIRATORY_TRACT
  Filled 2019-11-18 (×15): qty 3

## 2019-11-18 MED ORDER — LACTATED RINGERS IV SOLN: INTRAVENOUS | Status: DC

## 2019-11-18 NOTE — ED Provider Notes (Signed)
[facility] EMERGENCY DEPARTMENT Provider Note   CSN: [id number] Arrival date & time: 12/06/2019  1001     History No chief complaint on file.   Lawrence Dennis is a 49 y.o. male.  HPI Level 5 caveat secondary to cardiac arrest, altered mental status Unknown age male presents via EMS with report of cardiac arrest.  EMS [place] airport[place]  The patient was initially lying on the ground and rolling around.  He was nonverbal.  They then initially had a pulse.  The patient arrested as they were getting him into the ambulance.  He was on the monitor and had a PEA arrest.  Patient had CPR started immediately.  CPR was approximately 7 to 8 minutes.  An IO was placed.  Patient was intubated and received 1 round of epi.  He then received Narcan.  He received a second dose of epi and had an epi drip started.  He became bradycardic.  He was then paced.  He had pulses with his pacing.  They report that he had 2 episodes of 2-minute long possible seizures.  He received some Versed prehospital for this.  These resolved after the Versed.  There was no one present and no medical history or identification was able to be obtained.    No past medical history on file.  There are no problems to display for this patient.  Unknown   No family history on file. Unknown Social History   Tobacco Use  . Smoking status: Not on file  Substance Use Topics  . Alcohol use: Not on file  . Drug use: Not on file    Home Medications Prior to Admission medications   Not on File    Allergies    Patient has no allergy information on record.  Review of Systems   Review of Systems  Unable to perform ROS: Acuity of condition    Physical Exam Updated Vital Signs There were no vitals taken for this visit.  Physical Exam Vitals reviewed.  Constitutional:      Appearance: He is ill-appearing.     Comments: Thin male with ET tube in place  HENT:   Head: Normocephalic and atraumatic.     Right Ear: External ear normal.     Left Ear: External ear normal.     Nose: Nose normal.     Mouth/Throat:     Comments: ET tube in place Eyes:     Comments: Pupils are dilated and nonreactive  Neck:     Comments: Anterior neck appears normal with trachea midline no JVD noted Cardiovascular:     Rate and Rhythm: Normal rate and regular rhythm.     Pulses: Normal pulses.  Pulmonary:     Comments: Patient placed on vent and breathing over vent Abdominal:     General: Abdomen is flat.     Palpations: Abdomen is soft.  Musculoskeletal:     Cervical back: No rigidity.     Comments: Extremities revealed no obvious signs of trauma they are mottled bilaterally Ice found in right groin area- not placed by ems Skin reddened in this area  Skin:    Capillary Refill: Capillary refill takes less than 2 seconds.     Coloration: Skin is pale.  Neurological:     Comments: Patient is unresponsive but is breathing over the vent some No lateralized deficits are noted     ED Results / Procedures / Treatments  Labs (all labs ordered are listed, but only abnormal results are displayed) Labs Reviewed - No data to display  EKG EKG Interpretation  Date/Time:  Sunday Dec 06, 2019 10:04:43 EDT Ventricular Rate:  59 PR Interval:    QRS Duration: 142 QT Interval:  516 QTC Calculation: 512 R Axis:   86 Text Interpretation: Sinus rhythm Consider left atrial enlargement IVCD, consider atypical RBBB Confirmed by Margarita Grizzle [phone]) on Dec 06, 2019 10:15:09 AM   Radiology No results found.  Procedures ARTERIAL BLOOD GAS  Date/Time: 06-Dec-2019 10:46 AM Performed by: Margarita Grizzle, MD Authorized by: Margarita Grizzle, MD   Consent:    Consent obtained:  Emergent situation Anesthesia (see MAR for exact dosages):    Anesthesia method:  None Procedure details:    Location:  R femoral   Number of attempts:  1 Post-procedure details:    Dressing applied: yes      Bleeding:  Hemostasis achieved   Circulation, movement, and sensation:  Normal   Patient tolerance of procedure:  Tolerated well, no immediate complications  .Critical Care Performed by: Margarita Grizzle, MD Authorized by: Margarita Grizzle, MD   Critical care provider statement:    Critical care time (minutes):  60   Critical care end time:  06-Dec-2019 12:45 PM   Critical care was necessary to treat or prevent imminent or life-threatening deterioration of the following conditions:  Circulatory failure and CNS failure or compromise   Critical care was time spent personally by me on the following activities:  Discussions with consultants, evaluation of patient's response to treatment, examination of patient, ordering and performing treatments and interventions, ordering and review of laboratory studies, ordering and review of radiographic studies, pulse oximetry, re-evaluation of patient's condition, obtaining history from patient or surrogate and review of old charts   (including critical care time)  Medications Ordered in ED Medications - No data to display  ED Course  I have reviewed the triage vital signs and the nursing notes.  Pertinent labs & imaging results that were available during my care of the patient were reviewed by me and considered in my medical decision making (see chart for details). Patient with unkown medical history found in parking lot of hotel with decreased responsiveness.  EMS reports that this area is known for substance abuse.  They also report that in the community it is believed that ice will help someone who has overdosed. The patient initially had pulsed but then had pea arrest, the bradycardia. Pacing was initiated prehospital. Here pacemaker was discontinued with good pulses and blood pressure 113/79. Patient did receive Narcan prehospital but did not change his mental status or cardiovascular status.  Here he is receiving second dose of Narcan.  Patient is  also on epi drip prehospital and we will initiate epi drip here. Initial EKG without evidence of acute ischemia Patient is not currently doing any purposeful movement but is breathing over the vent Patient with cpr <10 minutes, with pulses with pacing en route and ROSC in ED Cooling initiated Critical care paged 10:45 AM pH 6.5 on ABG with bicarb 5 100 mEq of bicarb ordered Discussed with critical care- Dr. Myrla Halsted, and they will see for evaluation Plan cooling, but patient extremely hypothermic with temperature 87.9 Head CT ordered Sugar low at 1 amp D50 given  Clinical Course as of Nov 18 1047  Sun Dec 06, 2019  1047 Chest x-Maryela Tapper clear with hyperinflated lungs and ET tube present 3.5 cm above carina   [DR]  Clinical Course User Index [DR] Margarita Grizzle, MD  Urine drug screen positive for amphetamines, cocaine, and THC CBC reviewed and significant for leukocytosis with white count 17,900, otherwise within normal limits Lactic acid elevated at greater than 11 Troponin elevated at 2811 Transaminases elevated  Creatinine elevated at 3.67  Jonny Ruiz Dennis unknown age presents after cardiac arrest with hypothermia and labs consistent with gross organ dysfunction.  Additionally his initial pH was 6.5.  Patient has been breathing over the vent but has not noted to have any purposeful movement.  Cooling was initiated here in the ED.  Additionally he continued on vasopressors during my initial evaluation.  Heart rate and blood pressure were stable on pressors. Critical care consulted and has assumed care.  MDM Rules/Calculators/A&P                       Final Clinical Impression(s) / ED Diagnoses Final diagnoses:  Cardiac arrest (HCC)  Polysubstance abuse [facility])    Rx / DC Orders ED Discharge Orders    None       Margarita Grizzle, MD 11/23/2019 1246

## 2019-11-18 NOTE — Progress Notes (Signed)
Received pt from ED to 2H03 on ventilator. Pt stable throughout transport with no complications.

## 2019-11-18 NOTE — Progress Notes (Signed)
Pt being transferred to 2h. Will try back once settled to room

## 2019-11-18 NOTE — Procedures (Signed)
Patient Name: [name]  MRN: [id number]  Epilepsy Attending: Charlsie Quest  Referring Physician/Provider: Dr Levon Hedger Date: 2019/11/22 Duration: 26.39 mins  Patient history: Unknown age male s/p cardiac arrest on TTM. EEG to evaluate for seizure  Level of alertness: comatose  AEDs during EEG study: Propofol  Technical aspects: This EEG study was done with scalp electrodes positioned according to the 10-20 International system of electrode placement. Electrical activity was acquired at a sampling rate of 500Hz  and reviewed with a high frequency filter of 70Hz  and a low frequency filter of 1Hz . EEG data were recorded continuously and digitally stored.   DESCRIPTION: EEG showed continuous generalized 14-16hz  beta activity. EEG was not reactive to noxious stimulation.  Hyperventilation and photic stimulation were not performed.  Of note, eeg was technically difficult due to significant myogenic artifact.  ABNORMALITY - Beta coma, generalized  IMPRESSION: This study is suggestive of profound diffuse encephalopathy, non specific to etiology but could be secondary to drug intoxication, anoxic/hypoxic bain injury, medication effects. No seizures or epileptiform discharges were seen throughout the recording.  Blaklee Shores 

## 2019-11-18 NOTE — Progress Notes (Signed)
RT reported critcal abg ph value of 6.97 to Raymon Mutton NP at 1218. Marland Kitchen

## 2019-11-18 NOTE — Progress Notes (Signed)
Called to patient's room by RN.  Patient was desating into 42s.  Lavaged patient via bag and suctioned patient again, but only getting saline back.  Secretions have been tenacious today for the dayshift RT, and I was given this information in report.  Got patient back up to 97%, placed back on vent at 100% FIO2.  Will continue to monitor.

## 2019-11-18 NOTE — ED Triage Notes (Signed)
Per [facility] pt was found in parking lot, rolling around very restless but non verbal. While getting patient into EMS truck the patient became unresponsive, apneic and pulseless at approx 9:10. 2 rounds of CPR, 1 narcan and 2 epi given. Patient was PEA. Patient regained pulses with heart rate in 30s and began being paced with epi drip. EMS report seizure like activity and given 2.5 versed. Patient arrives intubated by EMS and no purposeful movement.

## 2019-11-18 NOTE — Procedures (Signed)
Arterial Catheter Insertion Procedure Note [name] [id number] 07/19/1876  Procedure: Insertion of Arterial Catheter  Indications: Blood pressure monitoring and Frequent blood sampling  Procedure Details Consent: Unable to obtain consent because of emergent medical necessity. Time Out: Verified patient identification, verified procedure, site/side was marked, verified correct patient position, special equipment/implants available, medications/allergies/relevent history reviewed, required imaging and test results available.  Performed  Maximum sterile technique was used including antiseptics, cap, gloves, gown, hand hygiene, mask and sheet. Skin prep: Chlorhexidine; local anesthetic administered 20 gauge catheter was inserted into right radial artery using the Seldinger technique. ULTRASOUND GUIDANCE USED: NO Evaluation Blood flow good; BP tracing good. Complications: No apparent complications.   Morley Kos 2019-12-10

## 2019-11-18 NOTE — Progress Notes (Signed)
RT to evaluate pt due to recent vent changes made by MD. Upon arrival to room pt abg improving. Verbal order received to decrease FiO2 at this time. Pt suctioned for large amount of oral secretions, pale white in color and thin. Pt then desaturated to 82%. RT able to pass suction catheter down ETT with no secretions in return. Pt Sats continued to stay 82-84%. Pt taken off ventilator and bagged with peep valve at 10cm and 100% FiO2. RN at bedside. NG evaluated and unable to hear when auscultated over abdomen. NG then hooked to suction and pt Spo2 began to rise. Dr. Chestine Spore notified. Pt Spo2 recovered to 100% and pt placed back on ventilator at this time. RT will continue to monitor.

## 2019-11-18 NOTE — Progress Notes (Signed)
EEG complete - results pending 

## 2019-11-18 NOTE — Procedures (Signed)
Central Venous Catheter Insertion Procedure Note [name] [id number] 07/19/1876  Procedure: Insertion of Central Venous Catheter Indications: Assessment of intravascular volume, Drug and/or fluid administration and Frequent blood sampling  Procedure Details Consent: Unable to obtain consent because of emergent medical necessity. Time Out: Verified patient identification, verified procedure, site/side was marked, verified correct patient position, special equipment/implants available, medications/allergies/relevent history reviewed, required imaging and test results available.  Performed  Maximum sterile technique was used including antiseptics, cap, gloves, gown, hand hygiene, mask and sheet. Skin prep: Chlorhexidine; local anesthetic administered A antimicrobial bonded/coated triple lumen catheter was placed in the right internal jugular vein using the Seldinger technique.  Evaluation Blood flow good Complications: No apparent complications Patient did tolerate procedure well. Chest X-ray ordered to verify placement.  CXR: pending.   Delfin Gant, NP-C [facility] Contact / Pager information can be found on Amion  12/05/2019, 12:14 PM

## 2019-11-18 NOTE — Progress Notes (Addendum)
Re evald made some vent changes. PH/O2 look better. CK, Pct and trop up Osmolar gap normal Ethanol neg EEG w/o seizures No immediate need for HD Bedside echo with stress cardiomyopathy pattern, no signs of large PE. Await formal echo. Continue TTM Add LR to bicarb Guarded prognosis  Additional 60 minutes of critical care time throughout day  Myrla Halsted MD PCCM

## 2019-11-18 NOTE — ED Notes (Signed)
Critical care at bedside  

## 2019-11-18 NOTE — Progress Notes (Addendum)
Patient began to desat to low 80's. RT needed to bag to recover sats. SEE RT note. White frothy secretions from mouth noted. TF stopped. Manually instilled air in NGT placed in ED, no sounds auscultated. In the initial report given by ED RN, it was stated that all lines had been verified by x-ray. NGT removed and new one placed by this RN. Manual air instilled and auscultated. MD aware. STAT chest xray and KUB placed. Will hold TF until X-Ray confirms placement. Will continue to monitor patient.

## 2019-11-18 NOTE — Progress Notes (Signed)
Pharmacy Antibiotic Note  Lawrence Dennis is a 49 y.o. male admitted on 12/05/2019 with sepsis.  Pharmacy has been consulted for Cefepime and vancomycin dosing. Unable to calculate accurate CrCl given unknown age of patient but estimated at ~25 mL/min    Plan: -Cefepime 2 g IV Q 24 hours -Vancomycin 1250 mg IV once, then start vancomycin 750 mg IV Q 24 hours  -Monitor CBC, renal fx, cultures and clinical progress -VT at SS   Height: 5\' 10"  (177.8 cm) Weight: 61.2 kg (135 lb) IBW/kg (Calculated) : 73  Temp (24hrs), Avg:88.1 F (31.2 C), Min:87.8 F (31 C), Max:88.3 F (31.3 C)  Recent Labs  Lab 12/05/2019 1007 11/29/2019 1046  WBC 17.9*  --   CREATININE 3.67* 3.00*  LATICACIDVEN >11.0*  --     Estimated Creatinine Clearance: -1.1 mL/min (A) (by C-G formula based on SCr of 3 mg/dL (H)).    Not on File  Antimicrobials this admission: Vanc 5/2 >>  Cefepime 5/2 >>   Dose adjustments this admission:   Microbiology results:  Thank you for allowing pharmacy to be a part of this patient's care.  7/2, PharmD., BCPS, BCCCP Clinical Pharmacist Clinical phone for 12/17/2019 until 10pm: [phone] If after 10pm, please refer to [facility] for unit-specific pharmacist

## 2019-11-18 NOTE — Progress Notes (Signed)
LTM EEG hooked up and running - no initial skin breakdown - push button tested - neuro notified. Same leads  monitored

## 2019-11-18 NOTE — Progress Notes (Signed)
eLink Physician-Brief Progress Note Patient Name: Lawrence Dennis DOB: 07/19/1876 MRN: [id number]   Date of Service  12/16/2019  HPI/Events of Note  Notified of desaturation. Reportedly with thick secretions earlier with no significatn secretions now.  eICU Interventions  Ordered CXR and to start bronchodilators eLink to be informed once CXR is done     Intervention Category Major Interventions: Hypoxemia - evaluation and management  Darl Pikes 11/29/2019, 10:17 PM

## 2019-11-18 NOTE — Progress Notes (Signed)
RN called the box, and we spoke with Dr. Violet Baldy.  She called for a stat CXR and for [place] to start CPT and Duonebs to see if that helped.  Patient still on 100% FIO2 on ventilator and is bouncing between upper 80s and upper 90s.  RN changed pulse ox just to see if that was the problem.  We have adjusted tube, I have lavaged patient three times using Ambu and then reconnecting patient to vent and suctioning.  Dr. Violet Baldy is aware of what is going on with patient, will continue to monitor.

## 2019-11-18 NOTE — H&P (Signed)
NAME:  Lawrence Dennis, MRN:  [id number], DOB:  07/19/1876, LOS: 0 ADMISSION DATE:  12-13-2019, CONSULTATION DATE:  2019-12-13 REFERRING MD:  Dr. Rosalia Hammers, CHIEF COMPLAINT:  Arrest   Brief History   Lawrence Dennis presenting with PEA arrest.  History of present illness   Unknown age male [place].  EMS called and as they were loading him into truck he lost pulse.  ~6 mins CPR before ROSC.  Poor mental status after ROSC.  CT head and CXR benign.  Initial lactate >11 and initial pH 6.5.  Utox cocaine/THC/amphetamines.  Past Medical History  Unknown  Significant Hospital Events   5/2 admitted  Consults:  PCCM  Procedures:  5/2: ETT, A line, central line  Significant Diagnostic Tests:  CT Head: no acute injury CXR: hyperinflation otherwise normal  Micro Data:  COVID>>  Antimicrobials:  5/2 Vanc >> 5/2 Cefepime >>   Interim history/subjective:  Admitting  Objective   Blood pressure 116/78, pulse 63, temperature (!) 87.8 F (31 C), resp. rate (!) 27, height 5\' 10"  (1.778 m), weight 61.2 kg, SpO2 100 %.        Intake/Output Summary (Last 24 hours) at Dec 13, 2019 1129 Last data filed at 12/13/19 1119 Gross per 24 hour  Intake 1000 ml  Output --  Net 1000 ml   Filed Weights   12/13/2019 1046  Weight: 61.2 kg    Examination: General: middle aged man with decorticate posturing HENT: ETT in place, pupils dilated but react, corneals intact Lungs: triggering vent, diminished BL Cardiovascular: RRR, ext warm Abdomen: incisional scars over RUQ/LUQ, soft, hypoactive BS Extremities: no edema Neuro: winces to pain and decorticate posturing, clamping down on ETT, will eval corneals/gag etc once more stable  Lactate undetectably high Low blood sugar responded  Resolved Hospital Problem list   N/A  Assessment & Plan:  Out of hospital PEA Arrest: leading differential here would be seizure from polysubstance abuse.  Initial EKG benign. - 33C protocol - EEG -  Prognostication ~48-72 hours after rewarming  Troponin leak- suspect type 2 given history and EKG - trend and check echo  Acute hypoxemic respiratory failure due to cardiac arrest - Vent support - VAP bundle  Shock: related to acidemic vasoplegia.  Can tx empirically for sepsis although lower suspicion. - Vanc/cefepime, Pct, f/u culture data  Severe combined metabolic and respiratory acidemia- bicarb pushes given x 3. - A line, increase MV on vent, bicarb gtt, recheck - If refractory, will need HD  Hypoglycemia- stress steroids  Polysubstance abuse-  - Supportive care - Thiamine, folate - Check alcohol level, osmolality to look for gap  Best practice:  Diet: TF Pain/Anxiety/Delirium protocol (if indicated): propofol/fentanyl VAP protocol (if indicated): ordered DVT prophylaxis: heparin GI prophylaxis: ppi Glucose control: SSI Mobility: BR Code Status: full Family Communication: unknown family, have asked case management to see if we can track any down Disposition: ICU, guarded prognosis  Labs   CBC: Recent Labs  Lab 12-13-2019 1007 12/13/2019 1044 13-Dec-2019 1046  WBC 17.9*  --   --   HGB 13.7 11.2* 11.2*  HCT 45.9 33.0* 33.0*  MCV 108.5*  --   --   PLT 160  --   --     Basic Metabolic Panel: Recent Labs  Lab 12/13/19 1007 2019/12/13 1044 2019/12/13 1046  NA 146* 138 140  K 5.1 4.6 4.9  CL 109  --  112*  CO2 <7*  --   --   GLUCOSE 66*  --  300*  BUN 31*  --  38*  CREATININE 3.67*  --  3.00*  CALCIUM 8.9  --   --    GFR: Estimated Creatinine Clearance: -1.1 mL/min (A) (by C-G formula based on SCr of 3 mg/dL (H)). Recent Labs  Lab 12/06/2019 1007  WBC 17.9*  LATICACIDVEN >11.0*    Liver Function Tests: Recent Labs  Lab 11/17/2019 1007  AST PENDING  ALT PENDING  ALKPHOS 77  BILITOT 0.8  PROT 5.5*  ALBUMIN 3.2*   No results for input(s): LIPASE, AMYLASE in the last 168 hours. No results for input(s): AMMONIA in the last 168 hours.  ABG      Component Value Date/Time   PHART 6.567 (LL) 12/13/2019 1044   PCO2ART 52.8 (H) 11/26/2019 1044   PO2ART 329 (H) 11/23/2019 1044   HCO3 5.5 (L) 12/08/2019 1044   TCO2 8 (L) 12/06/2019 1046   ACIDBASEDEF 30.0 (H) 11/26/2019 1044   O2SAT 99.0 12/11/2019 1044     Coagulation Profile: No results for input(s): INR, PROTIME in the last 168 hours.  Cardiac Enzymes: No results for input(s): CKTOTAL, CKMB, CKMBINDEX, TROPONINI in the last 168 hours.  HbA1C: No results found for: HGBA1C  CBG: Recent Labs  Lab 12/10/2019 1030 12/04/2019 1048  GLUCAP 48* 233*    Review of Systems:   Cannot assess  Past Medical History  Unknown  Surgical History   Unknown  Social History  Unknown  Family History   Unknown  Allergies Unknown  Home Medications  Unknown Critical care time: 50 minutes not including separately billable procedures

## 2019-11-19 ENCOUNTER — Inpatient Hospital Stay (HOSPITAL_COMMUNITY): Payer: Self-pay

## 2019-11-19 DIAGNOSIS — I469 Cardiac arrest, cause unspecified: Secondary | ICD-10-CM

## 2019-11-19 LAB — CBC
HCT: 37.1 % — ABNORMAL LOW (ref 39.0–52.0)
Hemoglobin: 13.6 g/dL (ref 13.0–17.0)
MCH: 32.7 pg (ref 26.0–34.0)
MCHC: 36.7 g/dL — ABNORMAL HIGH (ref 30.0–36.0)
MCV: 89.2 fL (ref 80.0–100.0)
Platelets: 52 10*3/uL — ABNORMAL LOW (ref 150–400)
RBC: 4.16 MIL/uL — ABNORMAL LOW (ref 4.22–5.81)
RDW: 12.4 % (ref 11.5–15.5)
WBC: 2 10*3/uL — ABNORMAL LOW (ref 4.0–10.5)
nRBC: 1 % — ABNORMAL HIGH (ref 0.0–0.2)

## 2019-11-19 LAB — POCT I-STAT 7, (LYTES, BLD GAS, ICA,H+H)
Acid-Base Excess: 8 mmol/L — ABNORMAL HIGH (ref 0.0–2.0)
Acid-base deficit: 3 mmol/L — ABNORMAL HIGH (ref 0.0–2.0)
Bicarbonate: 23.1 mmol/L (ref 20.0–28.0)
Bicarbonate: 31.5 mmol/L — ABNORMAL HIGH (ref 20.0–28.0)
Calcium, Ion: 0.82 mmol/L — CL (ref 1.15–1.40)
Calcium, Ion: 0.91 mmol/L — ABNORMAL LOW (ref 1.15–1.40)
HCT: 34 % — ABNORMAL LOW (ref 39.0–52.0)
HCT: 37 % — ABNORMAL LOW (ref 39.0–52.0)
Hemoglobin: 11.6 g/dL — ABNORMAL LOW (ref 13.0–17.0)
Hemoglobin: 12.6 g/dL — ABNORMAL LOW (ref 13.0–17.0)
O2 Saturation: 98 %
O2 Saturation: 98 %
Patient temperature: 33.2
Patient temperature: 35.5
Potassium: 3 mmol/L — ABNORMAL LOW (ref 3.5–5.1)
Potassium: 3.6 mmol/L (ref 3.5–5.1)
Sodium: 141 mmol/L (ref 135–145)
Sodium: 141 mmol/L (ref 135–145)
TCO2: 25 mmol/L (ref 22–32)
TCO2: 33 mmol/L — ABNORMAL HIGH (ref 22–32)
pCO2 arterial: 37.7 mmHg (ref 32.0–48.0)
pCO2 arterial: 39.2 mmHg (ref 32.0–48.0)
pH, Arterial: 7.361 (ref 7.350–7.450)
pH, Arterial: 7.525 — ABNORMAL HIGH (ref 7.350–7.450)
pO2, Arterial: 89 mmHg (ref 83.0–108.0)
pO2, Arterial: 95 mmHg (ref 83.0–108.0)

## 2019-11-19 LAB — GLUCOSE, CAPILLARY
Glucose-Capillary: 103 mg/dL — ABNORMAL HIGH (ref 70–99)
Glucose-Capillary: 104 mg/dL — ABNORMAL HIGH (ref 70–99)
Glucose-Capillary: 107 mg/dL — ABNORMAL HIGH (ref 70–99)
Glucose-Capillary: 134 mg/dL — ABNORMAL HIGH (ref 70–99)
Glucose-Capillary: 146 mg/dL — ABNORMAL HIGH (ref 70–99)
Glucose-Capillary: 166 mg/dL — ABNORMAL HIGH (ref 70–99)
Glucose-Capillary: 170 mg/dL — ABNORMAL HIGH (ref 70–99)
Glucose-Capillary: 170 mg/dL — ABNORMAL HIGH (ref 70–99)
Glucose-Capillary: 176 mg/dL — ABNORMAL HIGH (ref 70–99)
Glucose-Capillary: 181 mg/dL — ABNORMAL HIGH (ref 70–99)
Glucose-Capillary: 189 mg/dL — ABNORMAL HIGH (ref 70–99)
Glucose-Capillary: 201 mg/dL — ABNORMAL HIGH (ref 70–99)
Glucose-Capillary: 206 mg/dL — ABNORMAL HIGH (ref 70–99)
Glucose-Capillary: 217 mg/dL — ABNORMAL HIGH (ref 70–99)
Glucose-Capillary: 218 mg/dL — ABNORMAL HIGH (ref 70–99)
Glucose-Capillary: 230 mg/dL — ABNORMAL HIGH (ref 70–99)
Glucose-Capillary: 60 mg/dL — ABNORMAL LOW (ref 70–99)
Glucose-Capillary: 65 mg/dL — ABNORMAL LOW (ref 70–99)
Glucose-Capillary: 84 mg/dL (ref 70–99)
Glucose-Capillary: 86 mg/dL (ref 70–99)
Glucose-Capillary: 86 mg/dL (ref 70–99)
Glucose-Capillary: 91 mg/dL (ref 70–99)

## 2019-11-19 LAB — ECHOCARDIOGRAM COMPLETE
Height: 6 in
Weight: 2151.69 oz

## 2019-11-19 LAB — LACTIC ACID, PLASMA
Lactic Acid, Venous: 7.7 mmol/L (ref 0.5–1.9)
Lactic Acid, Venous: 9 mmol/L (ref 0.5–1.9)

## 2019-11-19 LAB — MAGNESIUM: Magnesium: 2.9 mg/dL — ABNORMAL HIGH (ref 1.7–2.4)

## 2019-11-19 LAB — VITAMIN B12: Vitamin B-12: >7500 pg/mL — ABNORMAL HIGH (ref 180–914)

## 2019-11-19 LAB — TROPONIN I (HIGH SENSITIVITY): Troponin I (High Sensitivity): 4731 ng/L (ref <18)

## 2019-11-19 LAB — PROCALCITONIN: Procalcitonin: 70.01 ng/mL

## 2019-11-19 LAB — PHOSPHORUS: Phosphorus: 7.6 mg/dL — ABNORMAL HIGH (ref 2.5–4.6)

## 2019-11-19 LAB — CK: Total CK: 18036 U/L — ABNORMAL HIGH (ref 49–397)

## 2019-11-19 MED ORDER — INSULIN ASPART 100 UNIT/ML ~~LOC~~ SOLN
0.0000 [IU] | SUBCUTANEOUS | Status: DC
  Administered 2019-11-21: 1 [IU] via SUBCUTANEOUS

## 2019-11-19 MED ORDER — PRO-STAT SUGAR FREE PO LIQD
30.0000 mL | Freq: Two times a day (BID) | ORAL | Status: DC
  Administered 2019-11-19 – 2019-11-21 (×5): 30 mL
  Filled 2019-11-19 (×5): qty 30

## 2019-11-19 MED ORDER — STERILE WATER FOR INJECTION IV SOLN
INTRAVENOUS | Status: DC
  Filled 2019-11-19 (×2): qty 850

## 2019-11-19 MED ORDER — SODIUM CHLORIDE 0.9% FLUSH: 10.0000 mL | INTRAVENOUS | Status: DC | PRN

## 2019-11-19 MED ORDER — VITAMIN K1 10 MG/ML IJ SOLN
1.0000 mg | Freq: Once | INTRAVENOUS | Status: AC
  Administered 2019-11-19: 1 mg via INTRAVENOUS
  Filled 2019-11-19: qty 0.1

## 2019-11-19 MED ORDER — NOREPINEPHRINE 16 MG/250ML-% IV SOLN
0.0000 ug/min | INTRAVENOUS | Status: DC
  Administered 2019-11-19: 27 ug/min via INTRAVENOUS
  Administered 2019-11-19: 48 ug/min via INTRAVENOUS
  Administered 2019-11-20 (×4): 50 ug/min via INTRAVENOUS
  Administered 2019-11-21: 45 ug/min via INTRAVENOUS
  Filled 2019-11-19 (×8): qty 250

## 2019-11-19 MED ORDER — CALCIUM CHLORIDE 10 % IV SOLN
INTRAVENOUS | Status: AC
  Administered 2019-11-19: 1000 mg
  Filled 2019-11-19: qty 10

## 2019-11-19 MED ORDER — SODIUM CHLORIDE 0.9% FLUSH
10.0000 mL | Freq: Two times a day (BID) | INTRAVENOUS | Status: DC
  Administered 2019-11-19 – 2019-11-24 (×9): 10 mL

## 2019-11-19 MED ORDER — PANTOPRAZOLE SODIUM 40 MG PO PACK
40.0000 mg | PACK | Freq: Every day | ORAL | Status: DC
  Administered 2019-11-19 – 2019-11-23 (×5): 40 mg
  Filled 2019-11-19 (×5): qty 20

## 2019-11-19 MED ORDER — CALCIUM CHLORIDE 10 % IV SOLN
1.0000 g | Freq: Once | INTRAVENOUS | Status: AC
  Administered 2019-11-19: 1 g via INTRAVENOUS

## 2019-11-19 MED ORDER — ADULT MULTIVITAMIN W/MINERALS CH
1.0000 | ORAL_TABLET | Freq: Every day | ORAL | Status: DC
  Administered 2019-11-19 – 2019-11-22 (×4): 1
  Filled 2019-11-19 (×4): qty 1

## 2019-11-19 MED ORDER — POTASSIUM CHLORIDE 20 MEQ/15ML (10%) PO SOLN
40.0000 meq | Freq: Once | ORAL | Status: AC
  Administered 2019-11-19: 40 meq
  Filled 2019-11-19: qty 30

## 2019-11-19 MED ORDER — SODIUM CHLORIDE 0.9 % IV BOLUS
1000.0000 mL | Freq: Once | INTRAVENOUS | Status: AC
  Administered 2019-11-19: 1000 mL via INTRAVENOUS

## 2019-11-19 MED ORDER — DEXTROSE 50 % IV SOLN: 12.5000 g | INTRAVENOUS | Status: AC

## 2019-11-19 MED ORDER — POTASSIUM CHLORIDE 20 MEQ PO PACK
40.0000 meq | PACK | Freq: Four times a day (QID) | ORAL | Status: DC
  Administered 2019-11-19 (×2): 40 meq via ORAL
  Filled 2019-11-19 (×3): qty 2

## 2019-11-19 MED ORDER — POTASSIUM CHLORIDE 20 MEQ/15ML (10%) PO SOLN
20.0000 meq | Freq: Once | ORAL | Status: AC
  Administered 2019-11-19: 20 meq
  Filled 2019-11-19: qty 15

## 2019-11-19 NOTE — Progress Notes (Signed)
CRITICAL VALUE ALERT  Critical Value:  K 2.6  Date & Time Notied:  11/19/19   12.20  Provider Notified: Dr. Denese Killings   Orders Received/Actions taken: 40 mEq q6h x 3 doses

## 2019-11-19 NOTE — Progress Notes (Signed)
eLink Physician-Brief Progress Note Patient Name: Lawrence Dennis DOB: 07/19/1876 MRN: [id number]   Date of Service  11/19/2019  HPI/Events of Note  K 3.1, creatinine 3. Maintenance phase of TTM. Rewarming to start this afternoon  eICU Interventions  Ordered K 20 meqs per OG x 1     Intervention Category Major Interventions: Electrolyte abnormality - evaluation and management  Darl Pikes 11/19/2019, 6:57 AM

## 2019-11-19 NOTE — Progress Notes (Signed)
Inpatient Diabetes Program Recommendations  AACE/ADA: New Consensus Statement on Inpatient Glycemic Control (2015)  Target Ranges:  Prepandial:   less than 140 mg/dL      Peak postprandial:   less than 180 mg/dL (1-2 hours)      Critically ill patients:  140 - 180 mg/dL   Results for Lawrence Dennis (MRN [id number]) as of 11/19/2019 08:42  Ref. Range 12/03/2019 18:44 11/21/2019 19:38 11/25/2019 20:42 11/23/2019 21:28 11/25/2019 22:41 12/12/2019 23:36 11/19/2019 00:34 11/19/2019 01:32 11/19/2019 02:36 11/19/2019 03:33 11/19/2019 04:32 11/19/2019 05:31 11/19/2019 06:42 11/19/2019 08:07  Glucose-Capillary Latest Ref Range: 70 - 99 mg/dL 262 (H)  IV Insulin Drip Started 266 (H)  IV Insulin Drip 237 (H)  IV Insulin Drip 236 (H)  IV Insulin Drip 202 (H)  IV Insulin Drip 228 (H)  IV Insulin Drip 218 (H)  IV Insulin Drip 217 (H)  IV Insulin Drip 230 (H)  IV Insulin Drip 189 (H)  IV Insulin Drip 206 (H)  IV Insulin Drip 201 (H)  IV Insulin Drip 176 (H)  IV Insulin Drip 181 (H)  IV Insulin Drip   Results for Lawrence Dennis (MRN [id number]) as of 11/19/2019 08:42  Ref. Range 11/27/2019 10:44  Amphetamines Latest Ref Range: NONE DETECTED  POSITIVE (A)  Barbiturates Latest Ref Range: NONE DETECTED  NONE DETECTED  Benzodiazepines Latest Ref Range: NONE DETECTED  NONE DETECTED  Opiates Latest Ref Range: NONE DETECTED  NONE DETECTED  COCAINE Latest Ref Range: NONE DETECTED  POSITIVE (A)  Tetrahydrocannabinol Latest Ref Range: NONE DETECTED  POSITIVE (A)   Admit with: Unknown age male [place] around [place].  EMS called and as they were loading him into truck he lost pulse.  ~6 mins CPR before ROSC.  Poor mental status after ROSC.  CT head and CXR benign.  Initial lactate >11 and initial pH 6.5.  Utox cocaine/THC/amphetamines  History: Unknown  Current Orders: IV Insulin Drip    Remains on Vent.  Getting Trickle Tube feeds at 20cc/hr.     Please leave patient on the IV Insulin Drip  until the following criteria have been met per the guidelines of the ICU Hyperglycemia Protocol:  -Insulin Drip rates consistently less than 8 units/hr -Tube feeds at Goal Rate -Patient has 4 consecutive CBGs <180 mg/dl    --Will follow patient during hospitalization--  Wyn Quaker RN, MSN, CDE Diabetes Coordinator Inpatient Glycemic Control Team Team Pager: [phone] (8a-5p)

## 2019-11-19 NOTE — Progress Notes (Signed)
NAME:  Lawrence Dennis, MRN:  [id number], DOB:  07/19/1876, LOS: 1 ADMISSION DATE:  12-08-2019, CONSULTATION DATE:  12/08/2019 REFERRING MD:  Dr. Jeanell Sparrow, CHIEF COMPLAINT:  Arrest   Brief History   Lawrence Dennis presenting with PEA arrest.  History of present illness   Unknown age male [place] around [place].  EMS called and as they were loading him into truck he lost pulse.  ~6 mins CPR before ROSC.  Poor mental status after ROSC.  CT head and CXR benign.  Initial lactate >11 and initial pH 6.5.  Utox cocaine/THC/amphetamines.  Past Medical History  Unknown  Significant Hospital Events   5/2 admitted  Consults:  PCCM  Procedures:  5/2: ETT, A line, central line  Significant Diagnostic Tests:  CT Head: no acute injury CXR: hyperinflation otherwise normal  Micro Data:  COVID>>  Antimicrobials:  5/2 Vanc >> 5/2 Cefepime >>   Interim history/subjective:  Admitting  Objective   Blood pressure 107/80, pulse 74, temperature (!) 91.6 F (33.1 C), temperature source Bladder, resp. rate (!) 25, height (!) 6" (0.152 m), weight 61 kg, SpO2 100 %. CVP:  [7 mmHg-9 mmHg] 8 mmHg  Vent Mode: PCV FiO2 (%):  [50 %-100 %] 70 % Set Rate:  [25 bmp-28 bmp] 25 bmp Vt Set:  [590 mL-610 mL] 610 mL PEEP:  [5 cmH20] 5 cmH20 Plateau Pressure:  [25 cmH20-28 cmH20] 25 cmH20   Intake/Output Summary (Last 24 hours) at 11/19/2019 0741 Last data filed at 11/19/2019 0700 Gross per 24 hour  Intake 6736.6 ml  Output 730 ml  Net 6006.6 ml   Filed Weights   Dec 08, 2019 1046 11/19/19 0328  Weight: 61.2 kg 61 kg    Examination: General: middle aged man with decorticate posturing HENT: ETT in place, pupils dilated but react, corneals intact Lungs: triggering vent, diminished BL Cardiovascular: RRR, ext warm Abdomen: incisional scars over RUQ/LUQ, soft, hypoactive BS Extremities: no edema Neuro: winces to pain and decorticate posturing, clamping down on ETT, will eval corneals/gag etc once more  stable  Lactate undetectably high Low blood sugar responded  Resolved Hospital Problem list   N/A  Assessment & Plan:   Critically ill due to witnessed out of hospital PEA arrest requiring TTM.  Short down time and immediate CPR - potential for reasonable neurological recovery. - TTM to 33C  - Neuro-prognostication at 48h post rewarming.  Critically ill due acute hypoxemic respiratory failure due to cardiac arrest requiring mechanical ventilation.  - Continue full ventilator support through cooling phase. SBT once rewarmed.   Possible aspiration pneumonia as suggested by copious secretions hyperinflation on CXR suggests underlying COPD. -Chest physiotherapy -Bronchodilators -Empiric antibiotics  Critically ill due to mixed distributive and cardiogenic shock requiring titration of vasopressors. -Titrate vasopressors to maintain normal tissue perfusion.  Possible stress cardiomyopathy with type II myocardial injury -Follow-up formal echocardiogram today.  Acute kidney injury with severe acidosis. -No indication for dialysis at this time. -Follow creatinine.  Avoid further renal injury.  Leukopenia and thrombocytopenia of unknown etiology. - Will need further work-up if recovers from cardiac arrest. -US abdomen to rule out cirrhosis given elevated INR  Daily Goals Checklist  Pain/Anxiety/Delirium protocol (if indicated): Continous fentanyl/propofol while on TTM, sedation interruption once re-warmed.  VAP protocol (if indicated): Bundle in place.  Respiratory support goals: PRVC, Wean FiO2, SBT once rewarmed. Blood pressure target: Titrate NE to keep MAP>65 DVT prophylaxis: Heparin 3 times daily Nutrition Status: High nutritional risk-trickle feeds GI prophylaxis: Pantoprazole Fluid status  goals: Appears clinically euvolemic. Urinary catheter: Guide hemodynamic management Central lines: Triple-lumen central line, arterial line Glucose control: Improving on insulin infusion.   On stress dose steroids.  Will change carrier fluid for bicarb infusion to sterile water. Mobility/therapy needs: Bedrest. Antibiotic de-escalation: Empiric vancomycin and cefepime. Home medication reconciliation: Unknown home medications. Daily labs: Daily CBC and CMP Code Status: Full code. Family Communication: Family still unknown.  Identity of patient still unclear. Disposition: ICU.   Labs   CBC: Recent Labs  Lab 12/06/19 1007 06-Dec-2019 1044 06-Dec-2019 1540 Dec 06, 2019 1747 12-06-19 2339 11/19/19 0532 11/19/19 0657  WBC 17.9*  --   --   --   --   --  2.0*  HGB 13.7   < > 12.6* 13.3 12.2* 12.6* 13.6  HCT 45.9   < > 37.0* 39.0 36.0* 37.0* 37.1*  MCV 108.5*  --   --   --   --   --  89.2  PLT 160  --   --   --   --   --  PENDING   < > = values in this interval not displayed.    Basic Metabolic Panel: Recent Labs  Lab 06-Dec-2019 1007 Dec 06, 2019 1044 Dec 06, 2019 1046 12-06-2019 1217 2019/12/06 1530 12/06/19 1540 12-06-19 1747 Dec 06, 2019 2339 2019/12/06 2347 11/19/19 0505 11/19/19 0532  NA 146*   < > 140   < > 140   < > 139 139 140 141 141  K 5.1   < > 4.9   < > 5.0   < > 4.2 3.4* 3.7 3.1* 3.0*  CL 109  --  112*  --  104  --   --   --  102 101  --   CO2 <7*  --   --   --  18*  --   --   --  17* 20*  --   GLUCOSE 66*  --  300*  --  235*  --   --   --  250* 229*  --   BUN 31*  --  38*  --  35*  --   --   --  38* 40*  --   CREATININE 3.67*  --  3.00*  --  3.13*  --   --   --  2.97* 3.00*  --   CALCIUM 8.9  --   --   --  7.3*  --   --   --  7.4* 6.7*  --   MG  --   --   --   --   --   --   --   --   --  2.9*  --   PHOS  --   --   --   --   --   --   --   --   --  7.6*  --    < > = values in this interval not displayed.   GFR: CrCl cannot be calculated (Unknown ideal weight.). Recent Labs  Lab 12/06/19 1007 12/06/2019 1530 12-06-19 1939 Dec 06, 2019 2348 11/19/19 0505 11/19/19 0657  PROCALCITON  --  52.51  --   --  70.01  --   WBC 17.9*  --   --   --   --  2.0*  LATICACIDVEN >11.0*   --  7.5* 7.7* 9.0*  --     Liver Function Tests: Recent Labs  Lab 06-Dec-2019 1007 11/19/19 0505  AST 3,759* PENDING  ALT 2,472* PENDING  ALKPHOS 77 61  BILITOT 0.8  1.3*  PROT 5.5* 4.1*  ALBUMIN 3.2* 2.4*   No results for input(s): LIPASE, AMYLASE in the last 168 hours. No results for input(s): AMMONIA in the last 168 hours.  ABG    Component Value Date/Time   PHART 7.361 11/19/2019 0532   PCO2ART 39.2 11/19/2019 0532   PO2ART 89 11/19/2019 0532   HCO3 23.1 11/19/2019 0532   TCO2 25 11/19/2019 0532   ACIDBASEDEF 3.0 (H) 11/19/2019 0532   O2SAT 98.0 11/19/2019 0532     Coagulation Profile: Recent Labs  Lab 11/21/2019 1007 12/08/2019 1747 11/27/2019 1939  INR 2.9* 1.9* 2.0*    Cardiac Enzymes: Recent Labs  Lab 12/10/2019 1530  CKTOTAL 9,230*    HbA1C: Hgb A1c MFr Bld  Date/Time Value Ref Range Status  11/17/2019 03:30 PM 5.3 4.8 - 5.6 % Final    Comment:    (NOTE) Pre diabetes:          5.7%-6.4% Diabetes:              >6.4% Glycemic control for   <7.0% adults with diabetes     CBG: Recent Labs  Lab 11/19/19 0236 11/19/19 0333 11/19/19 0432 11/19/19 0531 11/19/19 0642  GLUCAP 230* 189* 206* 201* 176*    CRITICAL CARE Performed by: Lynnell Catalan   Total critical care time: 50 minutes  Critical care time was exclusive of separately billable procedures and treating other patients.  Critical care was necessary to treat or prevent imminent or life-threatening deterioration.  Critical care was time spent personally by me on the following activities: development of treatment plan with patient and/or surrogate as well as nursing, discussions with consultants, evaluation of patient's response to treatment, examination of patient, obtaining history from patient or surrogate, ordering and performing treatments and interventions, ordering and review of laboratory studies, ordering and review of radiographic studies, pulse oximetry, re-evaluation of patient's  condition and participation in multidisciplinary rounds.  Lynnell Catalan, MD [facility] ICU Physician [facility] [place] [facility]  Pager: [phone] Mobile: [phone] After hours: [phone].  11/19/2019, 8:29 AM

## 2019-11-19 NOTE — Progress Notes (Signed)
Dr. Denese Killings updated about patients current labs, urine output for shift, and increasing pressor requirements. MAP goal changed to > 65.  Will continue to monitor.

## 2019-11-19 NOTE — Progress Notes (Signed)
   Vital HP held at 1030 for 6 hours in order to have abdominal ultrasound at 1630.

## 2019-11-19 NOTE — Progress Notes (Signed)
Hypoglycemic Event  CBG: 65  @ 1522   Treatment: Gave 67ml of D50 per protocol   Follow-up CBG: Time: 1547 CBG Result:146  Possible Reasons for Event: Holding tube feeds for abdominal ultrasound and rewarming initiated.      Merleen Milliner

## 2019-11-19 NOTE — Progress Notes (Signed)
  Echocardiogram 2D Echocardiogram has been performed.  Lawrence Dennis 11/19/2019, 8:55 AM

## 2019-11-19 NOTE — Progress Notes (Signed)
eLink Physician-Brief Progress Note Patient Name: Lawrence Dennis DOB: 07/19/1876 MRN: [id number]   Date of Service  11/19/2019  HPI/Events of Note  Hypotension - Likely related to Propofol IV infusion and warming phase of TTM. CVP = 7 and 2. Hyperglycemia - Blood glucose in 80's last several hours off of insulin IV infusion.   eICU Interventions  Will order: 1. Wean Propofol IV infusion as tolerated.  2. Bolus with 0.9 NaCl 1 liter IV over 1 hour now.  3. Change to Q 4 hour very sensitive Novolog SSI      Intervention Category Major Interventions: Hyperglycemia - active titration of insulin therapy;Hypotension - evaluation and management  [name] 11/19/2019, 10:27 PM

## 2019-11-19 NOTE — Progress Notes (Addendum)
1. KUB showed OGT coiled in stomach. OGT was pulled out 5 inches. KUB done again to verify placement--tube feeds resumed around 2130.  2. Pt was having some issues with oxygenation. Satting in high-mid 28s. TF turned back off. Pt suctioned, bagged, FiO2 turned up. Concern for mucous plug as very little secretions were suctioned, but what did come up was very thick. See RT note for additional details. CXR, Neb and Chest PT ordered--pt satting at 100 since.   3. 1 episode of shivering noted. Contacted CCM for paralytic; advised to start fentanyl gtt instead. No further issues with shivering since.  4. Labs have been relayed to CCM throughout night. UOP low and continuing to lessen--MD aware.  5. Contacted hotels around [place]. Front Information systems manager ID'd situation at [employer] [phone]). Still unable to ID pt. Unit phone number left with front desk as night staff's information of event is limited. No family or friends have contacted unit.  6. CDS referral placed. ID is [id number]. RN will continue to monitor closely.   Shivering noted again at 0600. Sedation increased and counter-warming measures applied. RN will continue to monitor.

## 2019-11-19 NOTE — Progress Notes (Signed)
    Started rewarming patient at 1330. Verified with Lenda Kelp.

## 2019-11-19 NOTE — Progress Notes (Signed)
Initial Nutrition Assessment  DOCUMENTATION CODES:   Not applicable  INTERVENTION:   Tube Feeding:  Continue Vital High Protein at 20 ml/hr Goal rate with current propofol: Vital High Protein at 40 ml/hr Pro-Stat 30 mL daily Provides 100 g of protein, 1090 kcals and   TF regimen and propofol at current rate providing 1670 total kcal/day   Add MVI with Minerals daily   NUTRITION DIAGNOSIS:   Inadequate oral intake related to acute illness as evidenced by NPO status.  GOAL:   Patient will meet greater than or equal to 90% of their needs  MONITOR:   Vent status, Labs, Weight trends, TF tolerance, Skin  REASON FOR ASSESSMENT:   Consult, Ventilator Enteral/tube feeding initiation and management  ASSESSMENT:   Lawrence Dennis (uknown age male) presenting post PEA arrest, acute respiratory failure. Unknown PMH. +urine tox or cocaine, THC, amphetamines  5/2 Admitted, Intubated, TTM  TTM 33 degrees Patient is currently intubated on ventilator support, sedated with fentanyl and propofol gtt, requiring levophed MV: 15.1 L/min Temp (24hrs), Avg:91.4 F (33 C), Min:90.1 F (32.3 C), Max:92.3 F (33.5 C)  Propofol: 22 ml/hr  Vital High Protein at 20 ml/hr via OG tube (tip in stomach)   Current wt 61 kg. Net +7 L  Unable to obtain diet and weight history from patient at this time  Labs: potassium 2.6 (L), elevated LFTs, CBGs 189-230 Meds: D5-1/2 NS at 75 ml/hr, insulin drip, thiamine, bicarb gtt  Diet Order:   Diet Order    None      EDUCATION NEEDS:   Not appropriate for education at this time  Skin:  Skin Assessment: Reviewed RN Assessment  Last BM:  no BM  Height:   Ht Readings from Last 1 Encounters:  11/19/19 6' (1.829 m)    Weight:   Wt Readings from Last 1 Encounters:  11/19/19 61 kg    BMI:  Body mass index is 18.24 kg/m.  Estimated Nutritional Needs:   Kcal:  1525-1830 kcals  Protein:  92-122 g  Fluid:  >/= 1.8 L   Romelle Starcher MS,  RDN, LDN, CNSC RD Pager Number and RD On-Call Pager Number Located in [facility]

## 2019-11-19 NOTE — Progress Notes (Signed)
  Restarted vital HP at rate of 58ml at 1700. Abdominal ultrasound was completed

## 2019-11-19 NOTE — Progress Notes (Signed)
CSW aware of consult for substance/abuse resources for patient. Unable to provide patient with resource until medically stable.   CSW will continue to follow to offer resources when medically stable.

## 2019-11-19 NOTE — Progress Notes (Signed)
LTM maint complete - no skin breakdown under: Fp1, F7   F3 T3

## 2019-11-19 NOTE — Procedures (Addendum)
Patient Name: [name]  MRN: [id number]  Epilepsy Attending: Charlsie Quest  Referring Physician/Provider: Raymon Mutton, NP Duration: 12/15/2019 1447 to 11/19/2019 1447  Patient history: Unknown age male s/p cardiac arrest on TTM. EEG to evaluate for seizure  Level of alertness: comatose  AEDs during EEG study: Propofol  Technical aspects: This EEG study was done with scalp electrodes positioned according to the 10-20 International system of electrode placement. Electrical activity was acquired at a sampling rate of 500Hz  and reviewed with a high frequency filter of 70Hz  and a low frequency filter of 1Hz . EEG data were recorded continuously and digitally stored.   DESCRIPTION: EEG initially showed continuous generalized 14-16hz  beta activity. Gradually, eeg showed generalized background attenutation lasting 5-10seconds with intermittent generalized 3-5 theta-delta slowing. Reactivity was not tested during this study. Hyperventilation and photic stimulation were not performed.  ABNORMALITY - Background attenuation, generalized - Intermittent slow, generalized  IMPRESSION: This study is suggestive of profound diffuse encephalopathy, non specific to etiology but could be secondary to drug intoxication, anoxic/hypoxic bain injury, medication effects. No seizures or epileptiform discharges were seen throughout the recording.  EEG appears to be improving compared to yesterday.     Jina Olenick 

## 2019-11-20 LAB — POCT I-STAT 7, (LYTES, BLD GAS, ICA,H+H)
Acid-Base Excess: 6 mmol/L — ABNORMAL HIGH (ref 0.0–2.0)
Acid-Base Excess: 0 mmol/L (ref 0.0–2.0)
Bicarbonate: 29.4 mmol/L — ABNORMAL HIGH (ref 20.0–28.0)
Bicarbonate: 25.3 mmol/L (ref 20.0–28.0)
Calcium, Ion: 0.94 mmol/L — ABNORMAL LOW (ref 1.15–1.40)
Calcium, Ion: 0.88 mmol/L — CL (ref 1.15–1.40)
HCT: 35 % — ABNORMAL LOW (ref 39.0–52.0)
HCT: 39 % (ref 39.0–52.0)
Hemoglobin: 11.9 g/dL — ABNORMAL LOW (ref 13.0–17.0)
Hemoglobin: 13.3 g/dL (ref 13.0–17.0)
O2 Saturation: 96 %
O2 Saturation: 90 %
Patient temperature: 36
Patient temperature: 36.7
Potassium: 3.4 mmol/L — ABNORMAL LOW (ref 3.5–5.1)
Potassium: 5.1 mmol/L (ref 3.5–5.1)
Sodium: 141 mmol/L (ref 135–145)
Sodium: 140 mmol/L (ref 135–145)
TCO2: 31 mmol/L (ref 22–32)
TCO2: 27 mmol/L (ref 22–32)
pCO2 arterial: 37.5 mmHg (ref 32.0–48.0)
pCO2 arterial: 42.7 mmHg (ref 32.0–48.0)
pH, Arterial: 7.499 — ABNORMAL HIGH (ref 7.350–7.450)
pH, Arterial: 7.38 (ref 7.350–7.450)
pO2, Arterial: 68 mmHg — ABNORMAL LOW (ref 83.0–108.0)
pO2, Arterial: 58 mmHg — ABNORMAL LOW (ref 83.0–108.0)

## 2019-11-20 LAB — GLUCOSE, CAPILLARY
Glucose-Capillary: 109 mg/dL — ABNORMAL HIGH (ref 70–99)
Glucose-Capillary: 110 mg/dL — ABNORMAL HIGH (ref 70–99)
Glucose-Capillary: 112 mg/dL — ABNORMAL HIGH (ref 70–99)
Glucose-Capillary: 113 mg/dL — ABNORMAL HIGH (ref 70–99)
Glucose-Capillary: 115 mg/dL — ABNORMAL HIGH (ref 70–99)
Glucose-Capillary: 53 mg/dL — ABNORMAL LOW (ref 70–99)
Glucose-Capillary: 58 mg/dL — ABNORMAL LOW (ref 70–99)
Glucose-Capillary: 66 mg/dL — ABNORMAL LOW (ref 70–99)
Glucose-Capillary: 67 mg/dL — ABNORMAL LOW (ref 70–99)
Glucose-Capillary: 89 mg/dL (ref 70–99)

## 2019-11-20 LAB — CBC
HCT: 37.6 % — ABNORMAL LOW (ref 39.0–52.0)
Hemoglobin: 13.8 g/dL (ref 13.0–17.0)
MCH: 32.3 pg (ref 26.0–34.0)
MCHC: 36.7 g/dL — ABNORMAL HIGH (ref 30.0–36.0)
MCV: 88.1 fL (ref 80.0–100.0)
Platelets: 76 10*3/uL — ABNORMAL LOW (ref 150–400)
RBC: 4.27 MIL/uL (ref 4.22–5.81)
RDW: 12.5 % (ref 11.5–15.5)
WBC: 7.4 10*3/uL (ref 4.0–10.5)
nRBC: 0 % (ref 0.0–0.2)

## 2019-11-20 LAB — LACTIC ACID, PLASMA: Lactic Acid, Venous: 4.8 mmol/L (ref 0.5–1.9)

## 2019-11-20 LAB — CK: Total CK: 18651 U/L — ABNORMAL HIGH (ref 49–397)

## 2019-11-20 MED ORDER — CALCIUM GLUCONATE-NACL 2-0.675 GM/100ML-% IV SOLN
2.0000 g | Freq: Once | INTRAVENOUS | Status: AC
  Administered 2019-11-20: 2000 mg via INTRAVENOUS
  Filled 2019-11-20: qty 100

## 2019-11-20 MED ORDER — DEXTROSE 50 % IV SOLN
INTRAVENOUS | Status: AC
  Administered 2019-11-20: 12.5 g via INTRAVENOUS
  Filled 2019-11-20: qty 50

## 2019-11-20 MED ORDER — DEXTROSE 50 % IV SOLN
12.5000 g | INTRAVENOUS | Status: AC
  Administered 2019-11-20: 12.5 g via INTRAVENOUS
  Filled 2019-11-20: qty 50

## 2019-11-20 MED ORDER — DEXTROSE 50 % IV SOLN: 12.5000 g | INTRAVENOUS | Status: AC

## 2019-11-20 MED ORDER — PHENYLEPHRINE CONCENTRATED 100MG/250ML (0.4 MG/ML) INFUSION SIMPLE
0.0000 ug/min | INTRAVENOUS | Status: DC
  Administered 2019-11-20: 190 ug/min via INTRAVENOUS
  Administered 2019-11-20: 180 ug/min via INTRAVENOUS
  Filled 2019-11-20 (×2): qty 250

## 2019-11-20 MED ORDER — DEXTROSE 50 % IV SOLN
12.5000 g | INTRAVENOUS | Status: AC
  Administered 2019-11-20: 12.5 g via INTRAVENOUS

## 2019-11-20 MED ORDER — SODIUM CHLORIDE 0.9 % IV BOLUS
1000.0000 mL | Freq: Once | INTRAVENOUS | Status: AC
  Administered 2019-11-20: 1000 mL via INTRAVENOUS

## 2019-11-20 MED ORDER — DEXTROSE-NACL 5-0.9 % IV SOLN: INTRAVENOUS | Status: DC

## 2019-11-20 MED ORDER — PHENYLEPHRINE HCL-NACL 10-0.9 MG/250ML-% IV SOLN
0.0000 ug/min | INTRAVENOUS | Status: DC
  Administered 2019-11-20: 20 ug/min via INTRAVENOUS
  Filled 2019-11-20 (×3): qty 250

## 2019-11-20 NOTE — Progress Notes (Signed)
eLink Physician-Brief Progress Note Patient Name: Lawrence Dennis DOB: 07/19/1876 MRN: [id number]   Date of Service  11/20/2019  HPI/Events of Note  Hypotension - BP = 102/57 with MAP = 69. Now on Norepinephrine IV infusion at ceiling dose.   eICU Interventions  Will order: 1. Phenylephrine IV infusion. Titrate to MAP >= 65.  2. Bolus with 0.9 NaCl 1 liter IV now over 1 hour now.      Intervention Category Major Interventions: Hypotension - evaluation and management  Beaumont Austad Eugene 11/20/2019, 1:00 AM

## 2019-11-20 NOTE — Progress Notes (Signed)
RN drawing labs and multiple ABGs on pt this AM.  RT will not draw ABG unless something changes between now and shift change that warrants a separate result.  RT will continue to monitor.

## 2019-11-20 NOTE — Progress Notes (Addendum)
CSW is working on trying to verify patients identity. Patient was found at [employer] parking lot located at [address]. CSW will continue to follow.   CSW will continue to follow.

## 2019-11-20 NOTE — Procedures (Addendum)
Patient Name:Lawrence Dennis ACG:[id number] Epilepsy Attending:Aaryana Betke Annabelle Harman Referring Physician/Provider:Whitney Earlene Plater, NP Duration:11/19/2019 1447 to 11/20/2019 1026  Patient history:Unknown age male s/p cardiac arrest on TTM. EEG to evaluate for seizure  Level of alertness:comatose  AEDs during EEG study:Propofol  Technical aspects: This EEG study was done with scalp electrodes positioned according to the 10-20 International system of electrode placement. Electrical activity was acquired at a sampling rate of 500Hz  and reviewed with a high frequency filter of 70Hz  and a low frequency filter of 1Hz . EEG data were recorded continuously and digitally stored.  DESCRIPTION: EEG showedcontinuous generalized  generalized 3-5theta-delta slowing. EEG was reactive to tactile stimulation. Hyperventilation and photic stimulation were not performed.  ABNORMALITY - Continuous slow, generalized  IMPRESSION: This study issuggestive ofprofound diffuse encephalopathy, non specific to etiology but could be secondary to drug intoxication, anoxic/hypoxic bain injury, medication effects.No seizures or epileptiform discharges were seen throughout the recording.  EEG continues to be improving compared to yesterday.     Monicia Tse 

## 2019-11-20 NOTE — Progress Notes (Signed)
LTM EEG discontinued - no skin breakdown at unhook.   

## 2019-11-20 NOTE — Progress Notes (Signed)
NAME:  Lawrence Dennis, MRN:  [id number], DOB:  07/19/1876, LOS: 2 ADMISSION DATE:  11/27/2019, CONSULTATION DATE:  12/10/2019 REFERRING MD:  Dr. Rosalia Hammers, CHIEF COMPLAINT:  Arrest   Brief History   Lawrence Dennis presenting with PEA arrest.  History of present illness   Unknown age [place]  EMS called and as they were loading him into truck he lost pulse.  ~6 mins CPR before ROSC.  Poor mental status after ROSC.  CT head and CXR benign.  Initial lactate >11 and initial pH 6.5.  Utox cocaine/THC/amphetamines.  Past Medical History  Unknown  Significant Hospital Events   5/2 admitted  Consults:  PCCM  Procedures:  5/2: ETT>> R rad A line 5/2>>> R IJ CVL 5/2>>>  Significant Diagnostic Tests:  CT Head: no acute injury CXR: hyperinflation otherwise normal 2D echo 5/3>> EF 50-55%, mod AS abd u/s 5/3>>> Small amount of perihepatic and pericholecystic ascites. Increased echotexture in the kidneys suggesting chronic medical renal disease.  Micro Data:  COVID>>NEG  Antimicrobials:  5/2 Vanc >> 5/2 Cefepime >>   Interim history/subjective:  Hypotensive overnight, now on multiple pressors.  Decreased UOP.  Rewarming.  Hypoglycemic. Acidosis improved.   Objective   Blood pressure 118/75, pulse (!) 116, temperature 98.6 F (37 C), temperature source Bladder, resp. rate (!) 26, height 6' (1.829 m), weight 63.5 kg, SpO2 96 %. CVP:  [8 mmHg-15 mmHg] 11 mmHg  Vent Mode: PCV FiO2 (%):  [50 %-70 %] 60 % Set Rate:  [25 bmp] 25 bmp PEEP:  [5 cmH20] 5 cmH20 Plateau Pressure:  [20 cmH20-22 cmH20] 21 cmH20   Intake/Output Summary (Last 24 hours) at 11/20/2019 0908 Last data filed at 11/20/2019 0700 Gross per 24 hour  Intake 6481.11 ml  Output 101 ml  Net 6380.11 ml   Filed Weights   12/13/2019 1046 11/19/19 0328 11/20/19 0400  Weight: 61.2 kg 61 kg 63.5 kg    Examination: General: male pt, NAD, sedated  HENT: ETT in place, pupils 23mm sluggish, mm moist, no  JVD  Lungs: resps even non labored on vent, diminished throughout Cardiovascular: RRR, ext warm Abdomen: incisional scars over RUQ/LUQ, soft, hypoactive BS Extremities: no sig edema Neuro: sedated, RASS -3, grimaces to pain   Resolved Hospital Problem list   N/A  Assessment & Plan:   Critically ill due to witnessed out of hospital PEA arrest requiring TTM.  Short down time and immediate CPR - potential for reasonable neurological recovery. PLAN -  Continue hypothermia protocol - rewarming  Neuro following   Acute hypoxemic respiratory failure due to cardiac arrest requiring mechanical ventilation.  PLAN -  Vent support - 8cc/kg  F/u CXR  F/u ABG SBT once fully rewarmed, more hemodynamically stable     Possible aspiration pneumonia as suggested by copious secretions hyperinflation on CXR suggests underlying COPD. PLAN -  Continue empiric abx as above D2/x BD's  F/u CXR    Shock - mixed distributive and cardiogenic shock. Acidosis resolved. Continues to require multiple vasopressors, CVP 16.   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 - Will need further work-up if recovers from cardiac arrest. -US abdomen to rule out cirrhosis given elevated INR  Daily Goals Checklist  Pain/Anxiety/Delirium protocol (if indicated): Continous fentanyl/propofol while on TTM, sedation interruption once re-warmed.  VAP protocol (if indicated): Bundle in place.  Respiratory support goals: PRVC, Wean  FiO2, SBT once rewarmed. Blood pressure target: Titrate NE to keep MAP>65 DVT prophylaxis: Heparin 3 times daily Nutrition Status: High nutritional risk-trickle feeds GI prophylaxis: Pantoprazole Fluid status goals: Appears clinically euvolemic. Urinary catheter: Guide hemodynamic management Central lines: Triple-lumen central line, arterial line Glucose control: Improving on insulin infusion.  On stress dose steroids.  Will change carrier fluid for bicarb infusion to sterile water. Mobility/therapy needs: Bedrest. Antibiotic de-escalation: Empiric vancomycin and cefepime. Home medication reconciliation: Unknown home medications. Daily labs: Daily CBC and CMP Code Status: Full code. Family Communication: Family still unknown.  Identity of patient still unclear. Disposition: ICU.   Labs   CBC: Recent Labs  Lab 05-Dec-2019 1007 12/05/2019 1044 11/19/19 0532 11/19/19 0657 11/19/19 2342 11/20/19 0214 11/20/19 0444  WBC 17.9*  --   --  2.0*  --   --  7.4  HGB 13.7   < > 12.6* 13.6 11.6* 11.9* 13.8  HCT 45.9   < > 37.0* 37.1* 34.0* 35.0* 37.6*  MCV 108.5*  --   --  89.2  --   --  88.1  PLT 160  --   --  52*  --   --  76*   < > = values in this interval not displayed.    Basic Metabolic Panel: Recent Labs  Lab 11/19/19 0505 11/19/19 0532 11/19/19 1114 11/19/19 1114 11/19/19 1730 11/19/19 2035 11/19/19 2342 11/20/19 0214 11/20/19 0444  NA 141   < > 142   < > 144 143 141 141 141  K 3.1*   < > 2.6*   < > 3.0* 3.9 3.6 3.4* 3.8  CL 101  --  99  --  97* 95*  --   --  100  CO2 20*  --  23  --  22 26  --   --  27  GLUCOSE 229*  --  168*  --  102* 90  --   --  61*  BUN 40*  --  43*  --  46* 48*  --   --  47*  CREATININE 3.00*  --  3.17*  --  3.29* 3.32*  --   --  3.46*  CALCIUM 6.7*  --  7.4*  --  6.7* 6.6*  --   --  6.6*  MG 2.9*  --   --   --   --   --   --   --   --   PHOS 7.6*  --   --   --   --   --   --   --   --    < > = values in this interval not displayed.    GFR: Estimated Creatinine Clearance: -1 mL/min (A) (by C-G formula based on SCr of 3.46 mg/dL (H)). Recent Labs  Lab 2019/12/05 1007 12/05/2019 1530 12-05-19 1939 2019/12/05 2348 11/19/19 0505 11/19/19 0657 11/20/19 0444  PROCALCITON  --  52.51  --   --  70.01  --   --   WBC 17.9*  --   --   --   --  2.0* 7.4  LATICACIDVEN >11.0*  --  7.5* 7.7* 9.0*  --   --     Liver Function Tests: Recent Labs  Lab Dec 06, 2019 1007 11/19/19 0505 11/20/19 0444  AST 3,759* 4,857* 2,022*  ALT 2,472* 2,635* 1,752*  ALKPHOS 77 61 73  BILITOT 0.8 1.3* 1.9*  PROT 5.5* 4.1* 3.9*  ALBUMIN 3.2* 2.4* 2.0*   No results for input(s): LIPASE, AMYLASE in the last 168 hours. No results for input(s): AMMONIA in the last 168 hours.  ABG    Component Value Date/Time   PHART 7.499 (H) 11/20/2019 0214   PCO2ART 37.5 11/20/2019 0214   PO2ART 68 (L) 11/20/2019 0214   HCO3 29.4 (H) 11/20/2019 0214   TCO2 31 11/20/2019 0214   ACIDBASEDEF 3.0 (H) 11/19/2019 0532   O2SAT 96.0 11/20/2019 0214     Coagulation Profile: Recent Labs  Lab 12/06/19 1007 2019/12/06 1747 12/06/19 1939  INR 2.9* 1.9* 2.0*    Cardiac Enzymes: Recent Labs  Lab 12-06-19 1530 11/19/19 0505  CKTOTAL 9,230* 18,036*    HbA1C: Hgb A1c MFr Bld  Date/Time Value Ref Range Status  12-06-2019 03:30 PM 5.3 4.8 - 5.6 % Final    Comment:    (NOTE) Pre diabetes:          5.7%-6.4% Diabetes:              >6.4% Glycemic control for   <7.0% adults with diabetes     CBG: Recent Labs  Lab 11/20/19 0037 11/20/19 0450 11/20/19 0452 11/20/19 0519 11/20/19 0732  GLUCAP 113* 58* 53* 89 66*    CRITICAL CARE Performed by: Darlina Sicilian   Total critical care time: 32 minutes  Critical care time was exclusive of separately billable procedures and treating other patients.  Critical care was necessary to treat or prevent imminent or life-threatening deterioration.  Critical care was time spent personally by me on the  following activities: development of treatment plan with patient and/or surrogate as well as nursing, discussions with consultants, evaluation of patient's response to treatment, examination of patient, obtaining history from patient or surrogate, ordering and performing treatments and interventions, ordering and review of laboratory studies, ordering and review of radiographic studies, pulse oximetry, re-evaluation of patient's condition and participation in multidisciplinary rounds.    Nickolas Madrid, NP Pulmonary/Critical Care Medicine  11/20/2019  9:08 AM

## 2019-11-20 NOTE — Progress Notes (Signed)
eLink Physician-Brief Progress Note Patient Name: [name] DOB: 07/19/1876 MRN: [id number]   Date of Service  11/20/2019  HPI/Events of Note  Multiple issues: 1. ABG on 60%/PC 22/Rate 25/P 5 = 7.525/37.7/95 and 2. Hypocalcemia - Ca++ = 0.82.  eICU Interventions  Will order: 1. D/C NaHCO3 IV infusion.  2. Replace Ca++.     Intervention Category Major Interventions: Respiratory failure - evaluation and management;Acid-Base disturbance - evaluation and management;Electrolyte abnormality - evaluation and management  Chara Marquard Eugene 11/20/2019, 12:03 AM

## 2019-11-21 ENCOUNTER — Inpatient Hospital Stay (HOSPITAL_COMMUNITY): Payer: Self-pay

## 2019-11-21 LAB — BASIC METABOLIC PANEL
Anion gap: 18 — ABNORMAL HIGH (ref 5–15)
Anion gap: 20 — ABNORMAL HIGH (ref 5–15)
Anion gap: 21 — ABNORMAL HIGH (ref 5–15)
Anion gap: 22 — ABNORMAL HIGH (ref 5–15)
Anion gap: 25 — ABNORMAL HIGH (ref 5–15)
Anion gap: 15 (ref 5–15)
Anion gap: 14 (ref 5–15)
BUN: 35 mg/dL — ABNORMAL HIGH (ref 6–20)
BUN: 38 mg/dL — ABNORMAL HIGH (ref 6–20)
BUN: 43 mg/dL — ABNORMAL HIGH (ref 6–20)
BUN: 46 mg/dL — ABNORMAL HIGH (ref 6–20)
BUN: 48 mg/dL — ABNORMAL HIGH (ref 6–20)
BUN: 72 mg/dL — ABNORMAL HIGH (ref 6–20)
BUN: 58 mg/dL — ABNORMAL HIGH (ref 6–20)
CO2: 17 mmol/L — ABNORMAL LOW (ref 22–32)
CO2: 18 mmol/L — ABNORMAL LOW (ref 22–32)
CO2: 22 mmol/L (ref 22–32)
CO2: 23 mmol/L (ref 22–32)
CO2: 26 mmol/L (ref 22–32)
CO2: 24 mmol/L (ref 22–32)
CO2: 24 mmol/L (ref 22–32)
Calcium: 6.6 mg/dL — ABNORMAL LOW (ref 8.9–10.3)
Calcium: 6.7 mg/dL — ABNORMAL LOW (ref 8.9–10.3)
Calcium: 7.3 mg/dL — ABNORMAL LOW (ref 8.9–10.3)
Calcium: 7.4 mg/dL — ABNORMAL LOW (ref 8.9–10.3)
Calcium: 7.4 mg/dL — ABNORMAL LOW (ref 8.9–10.3)
Calcium: 6.4 mg/dL — CL (ref 8.9–10.3)
Calcium: 6.6 mg/dL — ABNORMAL LOW (ref 8.9–10.3)
Chloride: 102 mmol/L (ref 98–111)
Chloride: 104 mmol/L (ref 98–111)
Chloride: 95 mmol/L — ABNORMAL LOW (ref 98–111)
Chloride: 97 mmol/L — ABNORMAL LOW (ref 98–111)
Chloride: 99 mmol/L (ref 98–111)
Chloride: 102 mmol/L (ref 98–111)
Chloride: 103 mmol/L (ref 98–111)
Creatinine, Ser: 2.97 mg/dL — ABNORMAL HIGH (ref 0.61–1.24)
Creatinine, Ser: 3.13 mg/dL — ABNORMAL HIGH (ref 0.61–1.24)
Creatinine, Ser: 3.17 mg/dL — ABNORMAL HIGH (ref 0.61–1.24)
Creatinine, Ser: 3.29 mg/dL — ABNORMAL HIGH (ref 0.61–1.24)
Creatinine, Ser: 3.32 mg/dL — ABNORMAL HIGH (ref 0.61–1.24)
Creatinine, Ser: 4.43 mg/dL — ABNORMAL HIGH (ref 0.61–1.24)
Creatinine, Ser: 3.72 mg/dL — ABNORMAL HIGH (ref 0.61–1.24)
GFR calc Af Amer: 12 mL/min — ABNORMAL LOW (ref >60)
GFR calc Af Amer: 12 mL/min — ABNORMAL LOW (ref >60)
GFR calc Af Amer: 13 mL/min — ABNORMAL LOW (ref >60)
GFR calc Af Amer: 13 mL/min — ABNORMAL LOW (ref >60)
GFR calc Af Amer: 14 mL/min — ABNORMAL LOW (ref >60)
GFR calc Af Amer: 17 mL/min — ABNORMAL LOW (ref >60)
GFR calc Af Amer: 21 mL/min — ABNORMAL LOW (ref >60)
GFR calc non Af Amer: 11 mL/min — ABNORMAL LOW (ref >60)
GFR calc non Af Amer: 11 mL/min — ABNORMAL LOW (ref >60)
GFR calc non Af Amer: 11 mL/min — ABNORMAL LOW (ref >60)
GFR calc non Af Amer: 11 mL/min — ABNORMAL LOW (ref >60)
GFR calc non Af Amer: 12 mL/min — ABNORMAL LOW (ref >60)
GFR calc non Af Amer: 15 mL/min — ABNORMAL LOW (ref >60)
GFR calc non Af Amer: 18 mL/min — ABNORMAL LOW (ref >60)
Glucose, Bld: 102 mg/dL — ABNORMAL HIGH (ref 70–99)
Glucose, Bld: 168 mg/dL — ABNORMAL HIGH (ref 70–99)
Glucose, Bld: 235 mg/dL — ABNORMAL HIGH (ref 70–99)
Glucose, Bld: 250 mg/dL — ABNORMAL HIGH (ref 70–99)
Glucose, Bld: 90 mg/dL (ref 70–99)
Glucose, Bld: 142 mg/dL — ABNORMAL HIGH (ref 70–99)
Glucose, Bld: 145 mg/dL — ABNORMAL HIGH (ref 70–99)
Potassium: 2.6 mmol/L — CL (ref 3.5–5.1)
Potassium: 3 mmol/L — ABNORMAL LOW (ref 3.5–5.1)
Potassium: 3.7 mmol/L (ref 3.5–5.1)
Potassium: 3.9 mmol/L (ref 3.5–5.1)
Potassium: 5 mmol/L (ref 3.5–5.1)
Potassium: 6.4 mmol/L (ref 3.5–5.1)
Potassium: 6 mmol/L — ABNORMAL HIGH (ref 3.5–5.1)
Sodium: 140 mmol/L (ref 135–145)
Sodium: 140 mmol/L (ref 135–145)
Sodium: 142 mmol/L (ref 135–145)
Sodium: 143 mmol/L (ref 135–145)
Sodium: 144 mmol/L (ref 135–145)
Sodium: 141 mmol/L (ref 135–145)
Sodium: 141 mmol/L (ref 135–145)

## 2019-11-21 LAB — POCT I-STAT 7, (LYTES, BLD GAS, ICA,H+H)
Acid-Base Excess: 2 mmol/L (ref 0.0–2.0)
Bicarbonate: 26.4 mmol/L (ref 20.0–28.0)
Calcium, Ion: 0.86 mmol/L — CL (ref 1.15–1.40)
HCT: 35 % — ABNORMAL LOW (ref 39.0–52.0)
Hemoglobin: 11.9 g/dL — ABNORMAL LOW (ref 13.0–17.0)
O2 Saturation: 97 %
Patient temperature: 37.1
Potassium: 5.7 mmol/L — ABNORMAL HIGH (ref 3.5–5.1)
Sodium: 140 mmol/L (ref 135–145)
TCO2: 28 mmol/L (ref 22–32)
pCO2 arterial: 40.4 mmHg (ref 32.0–48.0)
pH, Arterial: 7.423 (ref 7.350–7.450)
pO2, Arterial: 90 mmHg (ref 83.0–108.0)

## 2019-11-21 LAB — RENAL FUNCTION PANEL
Albumin: 1.7 g/dL — ABNORMAL LOW (ref 3.5–5.0)
Anion gap: 12 (ref 5–15)
BUN: 64 mg/dL — ABNORMAL HIGH (ref 6–20)
CO2: 24 mmol/L (ref 22–32)
Calcium: 6.5 mg/dL — ABNORMAL LOW (ref 8.9–10.3)
Chloride: 105 mmol/L (ref 98–111)
Creatinine, Ser: 3.99 mg/dL — ABNORMAL HIGH (ref 0.61–1.24)
GFR calc Af Amer: 19 mL/min — ABNORMAL LOW (ref >60)
GFR calc non Af Amer: 17 mL/min — ABNORMAL LOW (ref >60)
Glucose, Bld: 133 mg/dL — ABNORMAL HIGH (ref 70–99)
Phosphorus: 5.1 mg/dL — ABNORMAL HIGH (ref 2.5–4.6)
Potassium: 5.9 mmol/L — ABNORMAL HIGH (ref 3.5–5.1)
Sodium: 141 mmol/L (ref 135–145)

## 2019-11-21 LAB — COMPREHENSIVE METABOLIC PANEL
ALT: 2472 U/L — ABNORMAL HIGH (ref 0–44)
ALT: 2635 U/L — ABNORMAL HIGH (ref 0–44)
ALT: 1752 U/L — ABNORMAL HIGH (ref 0–44)
ALT: 1229 U/L — ABNORMAL HIGH (ref 0–44)
AST: 3759 U/L — ABNORMAL HIGH (ref 15–41)
AST: 4857 U/L — ABNORMAL HIGH (ref 15–41)
AST: 2022 U/L — ABNORMAL HIGH (ref 15–41)
AST: 1367 U/L — ABNORMAL HIGH (ref 15–41)
Albumin: 3.2 g/dL — ABNORMAL LOW (ref 3.5–5.0)
Albumin: 2.4 g/dL — ABNORMAL LOW (ref 3.5–5.0)
Albumin: 2 g/dL — ABNORMAL LOW (ref 3.5–5.0)
Albumin: 1.8 g/dL — ABNORMAL LOW (ref 3.5–5.0)
Alkaline Phosphatase: 77 U/L (ref 38–126)
Alkaline Phosphatase: 61 U/L (ref 38–126)
Alkaline Phosphatase: 73 U/L (ref 38–126)
Alkaline Phosphatase: 92 U/L (ref 38–126)
Anion gap: 20 — ABNORMAL HIGH (ref 5–15)
Anion gap: 14 (ref 5–15)
Anion gap: 17 — ABNORMAL HIGH (ref 5–15)
BUN: 31 mg/dL — ABNORMAL HIGH (ref 6–20)
BUN: 40 mg/dL — ABNORMAL HIGH (ref 6–20)
BUN: 47 mg/dL — ABNORMAL HIGH (ref 6–20)
BUN: 70 mg/dL — ABNORMAL HIGH (ref 6–20)
CO2: <7 mmol/L — ABNORMAL LOW (ref 22–32)
CO2: 20 mmol/L — ABNORMAL LOW (ref 22–32)
CO2: 27 mmol/L (ref 22–32)
CO2: 24 mmol/L (ref 22–32)
Calcium: 8.9 mg/dL (ref 8.9–10.3)
Calcium: 6.7 mg/dL — ABNORMAL LOW (ref 8.9–10.3)
Calcium: 6.6 mg/dL — ABNORMAL LOW (ref 8.9–10.3)
Calcium: 6.3 mg/dL — CL (ref 8.9–10.3)
Chloride: 109 mmol/L (ref 98–111)
Chloride: 101 mmol/L (ref 98–111)
Chloride: 100 mmol/L (ref 98–111)
Chloride: 101 mmol/L (ref 98–111)
Creatinine, Ser: 3.67 mg/dL — ABNORMAL HIGH (ref 0.61–1.24)
Creatinine, Ser: 3 mg/dL — ABNORMAL HIGH (ref 0.61–1.24)
Creatinine, Ser: 3.46 mg/dL — ABNORMAL HIGH (ref 0.61–1.24)
Creatinine, Ser: 4.73 mg/dL — ABNORMAL HIGH (ref 0.61–1.24)
GFR calc Af Amer: 11 mL/min — ABNORMAL LOW (ref >60)
GFR calc Af Amer: 14 mL/min — ABNORMAL LOW (ref >60)
GFR calc Af Amer: 12 mL/min — ABNORMAL LOW (ref >60)
GFR calc Af Amer: 8 mL/min — ABNORMAL LOW (ref >60)
GFR calc non Af Amer: 9 mL/min — ABNORMAL LOW (ref >60)
GFR calc non Af Amer: 12 mL/min — ABNORMAL LOW (ref >60)
GFR calc non Af Amer: 10 mL/min — ABNORMAL LOW (ref >60)
GFR calc non Af Amer: 7 mL/min — ABNORMAL LOW (ref >60)
Glucose, Bld: 66 mg/dL — ABNORMAL LOW (ref 70–99)
Glucose, Bld: 229 mg/dL — ABNORMAL HIGH (ref 70–99)
Glucose, Bld: 61 mg/dL — ABNORMAL LOW (ref 70–99)
Glucose, Bld: 135 mg/dL — ABNORMAL HIGH (ref 70–99)
Potassium: 5.1 mmol/L (ref 3.5–5.1)
Potassium: 3.1 mmol/L — ABNORMAL LOW (ref 3.5–5.1)
Potassium: 3.8 mmol/L (ref 3.5–5.1)
Potassium: 5.9 mmol/L — ABNORMAL HIGH (ref 3.5–5.1)
Sodium: 146 mmol/L — ABNORMAL HIGH (ref 135–145)
Sodium: 141 mmol/L (ref 135–145)
Sodium: 141 mmol/L (ref 135–145)
Sodium: 142 mmol/L (ref 135–145)
Total Bilirubin: 0.8 mg/dL (ref 0.3–1.2)
Total Bilirubin: 1.3 mg/dL — ABNORMAL HIGH (ref 0.3–1.2)
Total Bilirubin: 1.9 mg/dL — ABNORMAL HIGH (ref 0.3–1.2)
Total Bilirubin: 1.7 mg/dL — ABNORMAL HIGH (ref 0.3–1.2)
Total Protein: 5.5 g/dL — ABNORMAL LOW (ref 6.5–8.1)
Total Protein: 4.1 g/dL — ABNORMAL LOW (ref 6.5–8.1)
Total Protein: 3.9 g/dL — ABNORMAL LOW (ref 6.5–8.1)
Total Protein: 4.2 g/dL — ABNORMAL LOW (ref 6.5–8.1)

## 2019-11-21 LAB — POCT I-STAT, CHEM 8
BUN: 38 mg/dL — ABNORMAL HIGH (ref 6–20)
BUN: 51 mg/dL — ABNORMAL HIGH (ref 6–20)
Calcium, Ion: 1.05 mmol/L — ABNORMAL LOW (ref 1.15–1.40)
Calcium, Ion: 0.83 mmol/L — CL (ref 1.15–1.40)
Chloride: 112 mmol/L — ABNORMAL HIGH (ref 98–111)
Chloride: 102 mmol/L (ref 98–111)
Creatinine, Ser: 3 mg/dL — ABNORMAL HIGH (ref 0.61–1.24)
Creatinine, Ser: 4.2 mg/dL — ABNORMAL HIGH (ref 0.61–1.24)
Glucose, Bld: 300 mg/dL — ABNORMAL HIGH (ref 70–99)
Glucose, Bld: 120 mg/dL — ABNORMAL HIGH (ref 70–99)
HCT: 33 % — ABNORMAL LOW (ref 39.0–52.0)
HCT: 38 % — ABNORMAL LOW (ref 39.0–52.0)
Hemoglobin: 11.2 g/dL — ABNORMAL LOW (ref 13.0–17.0)
Hemoglobin: 12.9 g/dL — ABNORMAL LOW (ref 13.0–17.0)
Potassium: 4.9 mmol/L (ref 3.5–5.1)
Potassium: 5.1 mmol/L (ref 3.5–5.1)
Sodium: 140 mmol/L (ref 135–145)
Sodium: 140 mmol/L (ref 135–145)
TCO2: 8 mmol/L — ABNORMAL LOW (ref 22–32)
TCO2: 25 mmol/L (ref 22–32)

## 2019-11-21 LAB — GLUCOSE, CAPILLARY
Glucose-Capillary: 116 mg/dL — ABNORMAL HIGH (ref 70–99)
Glucose-Capillary: 121 mg/dL — ABNORMAL HIGH (ref 70–99)
Glucose-Capillary: 122 mg/dL — ABNORMAL HIGH (ref 70–99)
Glucose-Capillary: 127 mg/dL — ABNORMAL HIGH (ref 70–99)
Glucose-Capillary: 134 mg/dL — ABNORMAL HIGH (ref 70–99)
Glucose-Capillary: 138 mg/dL — ABNORMAL HIGH (ref 70–99)
Glucose-Capillary: 154 mg/dL — ABNORMAL HIGH (ref 70–99)

## 2019-11-21 LAB — CBC
HCT: 36.4 % — ABNORMAL LOW (ref 39.0–52.0)
Hemoglobin: 12.8 g/dL — ABNORMAL LOW (ref 13.0–17.0)
MCH: 31.6 pg (ref 26.0–34.0)
MCHC: 35.2 g/dL (ref 30.0–36.0)
MCV: 89.9 fL (ref 80.0–100.0)
Platelets: 60 10*3/uL — ABNORMAL LOW (ref 150–400)
RBC: 4.05 MIL/uL — ABNORMAL LOW (ref 4.22–5.81)
RDW: 13.2 % (ref 11.5–15.5)
WBC: 14.9 10*3/uL — ABNORMAL HIGH (ref 4.0–10.5)
nRBC: 0 % (ref 0.0–0.2)

## 2019-11-21 LAB — MAGNESIUM: Magnesium: 1.6 mg/dL — ABNORMAL LOW (ref 1.7–2.4)

## 2019-11-21 LAB — PHOSPHORUS: Phosphorus: 4.8 mg/dL — ABNORMAL HIGH (ref 2.5–4.6)

## 2019-11-21 LAB — TRIGLYCERIDES: Triglycerides: 147 mg/dL (ref <150)

## 2019-11-21 MED ORDER — PRISMASOL BGK 0/2.5 32-2.5 MEQ/L IV SOLN
INTRAVENOUS | Status: DC
  Filled 2019-11-21 (×29): qty 5000

## 2019-11-21 MED ORDER — CALCIUM GLUCONATE-NACL 1-0.675 GM/50ML-% IV SOLN
1.0000 g | Freq: Once | INTRAVENOUS | Status: AC
  Administered 2019-11-21: 1000 mg via INTRAVENOUS
  Filled 2019-11-21: qty 50

## 2019-11-21 MED ORDER — PRISMASOL BGK 0/2.5 32-2.5 MEQ/L IV SOLN
INTRAVENOUS | Status: DC
  Filled 2019-11-21 (×10): qty 5000

## 2019-11-21 MED ORDER — MAGNESIUM SULFATE IN D5W 1-5 GM/100ML-% IV SOLN
1.0000 g | Freq: Once | INTRAVENOUS | Status: AC
  Administered 2019-11-21: 1 g via INTRAVENOUS
  Filled 2019-11-21: qty 100

## 2019-11-21 MED ORDER — PRISMASOL B22GK 4/0 22-4 MEQ/L IV SOLN: INTRAVENOUS | Status: DC

## 2019-11-21 MED ORDER — HEPARIN SODIUM (PORCINE) 1000 UNIT/ML IJ SOLN
3000.0000 [IU] | Freq: Once | INTRAMUSCULAR | Status: AC
  Administered 2019-11-21: 3800 [IU] via INTRAVENOUS
  Filled 2019-11-21: qty 3

## 2019-11-21 MED ORDER — PRISMASOL BGK 0/2.5 32-2.5 MEQ/L IV SOLN
INTRAVENOUS | Status: DC
  Filled 2019-11-21 (×6): qty 5000

## 2019-11-21 MED ORDER — SODIUM ZIRCONIUM CYCLOSILICATE 10 G PO PACK
10.0000 g | PACK | Freq: Once | ORAL | Status: AC
  Administered 2019-11-21: 10 g
  Filled 2019-11-21: qty 1

## 2019-11-21 MED ORDER — HEPARIN SODIUM (PORCINE) 1000 UNIT/ML DIALYSIS
1000.0000 [IU] | INTRAMUSCULAR | Status: DC | PRN
  Filled 2019-11-21 (×2): qty 6

## 2019-11-21 MED ORDER — VITAL HIGH PROTEIN PO LIQD: 1000.0000 mL | ORAL | Status: DC

## 2019-11-21 MED ORDER — SODIUM CHLORIDE 0.9 % IV SOLN
2.0000 g | Freq: Two times a day (BID) | INTRAVENOUS | Status: DC
  Administered 2019-11-21 (×2): 2 g via INTRAVENOUS
  Filled 2019-11-21 (×4): qty 2

## 2019-11-21 MED ORDER — PRO-STAT SUGAR FREE PO LIQD
30.0000 mL | Freq: Every day | ORAL | Status: DC
  Administered 2019-11-22: 30 mL
  Filled 2019-11-21: qty 30

## 2019-11-21 MED ORDER — HEPARIN (PORCINE) 2000 UNITS/L FOR CRRT
INTRAVENOUS_CENTRAL | Status: DC | PRN
  Filled 2019-11-21: qty 1000

## 2019-11-21 MED ORDER — PRISMASOL BGK 4/2.5 32-4-2.5 MEQ/L IV SOLN: INTRAVENOUS | Status: DC

## 2019-11-21 MED ORDER — CALCIUM GLUCONATE-NACL 1-0.675 GM/50ML-% IV SOLN
1.0000 g | Freq: Once | INTRAVENOUS | Status: AC
  Administered 2019-11-21: 23:00:00 1000 mg via INTRAVENOUS
  Filled 2019-11-21: qty 50

## 2019-11-21 NOTE — Progress Notes (Addendum)
Pharmacy Antibiotic Note  Lawrence Dennis was admitted on Dec 07, 2019 s/p PEA arrest with sepsis/possible aspiration PNA.  Pharmacy has been consulted for Cefepime (day 4). CRRT started 5/5 -WBC= 14.9, afebrile -SCr= 4.73 (3.6 on admission) -cultures: ngtd   Plan: -Change cefepime to 2 g IV Q12 hours (due to CRRT) -Consider 7 days antibiotics? -Monitor CBC, renal fx, cultures and clinical progress   Height: 6' (182.9 cm) Weight: 70.7 kg (155 lb 14.9 oz) IBW/kg (Calculated) : 77.6  Temp (24hrs), Avg:98.7 F (37.1 C), Min:98.2 F (36.8 C), Max:98.8 F (37.1 C)  Recent Labs  Lab 12/07/2019 1007 12/07/2019 1046 2019-12-07 1939 07-Dec-2019 2347 12-07-19 2348 11/19/19 0505 11/19/19 0657 11/19/19 1114 11/19/19 1730 11/19/19 2035 11/20/19 0444 11/20/19 1608 11/20/19 1814 11/21/19 0438  WBC 17.9*  --   --   --   --   --  2.0*  --   --   --  7.4  --   --  14.9*  CREATININE 3.67*   < >  --    < >  --  3.00*  --    < > 3.29* 3.32* 3.46*  --  4.20* 4.73*  LATICACIDVEN >11.0*  --  7.5*  --  7.7* 9.0*  --   --   --   --   --  4.8*  --   --    < > = values in this interval not displayed.    Estimated Creatinine Clearance: -0.8 mL/min (A) (by C-G formula based on SCr of 4.73 mg/dL (H)).    Not on File  Antimicrobials this admission: Vanc 5/2 >> 5/4 Cefepime 5/2 >>   Dose adjustments this admission:   Microbiology results: 5/2 blood x2- ngtd MRSA PCR- neg  Thank you for allowing pharmacy to be a part of this patient's care.  Harland German, PharmD Clinical Pharmacist **Pharmacist phone directory can now be found on [email or web address] ([email or web address]).  Listed under [facility].

## 2019-11-21 NOTE — Progress Notes (Signed)
Received call from [employer] regarding fingerprint match for identification of Mr. Lawrence Dennis.  The fingerprints match to an individual named [employer], DOB Apr 17, 2071.  Admitting notified, but there was uncertainty at this time, with the pt unconscious, as to our ability to change the information in the computer.   Lonia Blood, RN

## 2019-11-21 NOTE — Progress Notes (Signed)
eLink Physician-Brief Progress Note Patient Name: Lawrence Dennis DOB: March 05, 1971 MRN: [id number]   Date of Service  11/21/2019  HPI/Events of Note  Hypocalcemia - Ca++ = 6.6 which corrects to 8.44 (Low) given albumin = 1.7.  eICU Interventions  Will replace Ca++.     Intervention Category Major Interventions: Electrolyte abnormality - evaluation and management  [name] Eugene 11/21/2019, 10:29 PM

## 2019-11-21 NOTE — Progress Notes (Signed)
Family updated about visiting hours and policy. Designated visitors will be Philis Fendt (mother) [phone] and Thurston Pounds (sister) [phone]  Patient also has a wife whom he is separated from for 7 months [employer] [phone]. Bonita Quin wishes to video conference at 8pm 11/21/19

## 2019-11-21 NOTE — Progress Notes (Signed)
eLink Physician-Brief Progress Note Patient Name: Lawrence Dennis DOB: 07/19/1876 MRN: [id number]   Date of Service  11/21/2019  HPI/Events of Note  Multiple issues: 1. Hyperkalemia - K+ = 5.9, 2. Hypocalcemia - CA++ = 6.3 which corrects to 8.06 (Low) given Albumin = 1.8 and 3. Hypomagnesemia - Mg++ = 1.6.   eICU Interventions  Will oder: 1. Replace Ca++ and Mg++. 2. Lokelma 10 gm per tube now. 3. Repeat BMP at 1 PM.      Intervention Category Major Interventions: Electrolyte abnormality - evaluation and management  Sommer,Steven Eugene 11/21/2019, 6:32 AM

## 2019-11-21 NOTE — Consult Note (Addendum)
Reason for Consult: AKI Referring Physician:  Dr. Ulanda Edison Conway is an 49 y.o. male.  HPI: Patient is a 49 yo male with PMH COPD and tobacco abuse, [place] around [place], progressed to PEA arrest with EMS.  Had 6 min of CPR prior to ROSC, has had AMS since that time.  CT head in ED WNL.  CXR with possible LLL pneumonia.  In ED, initial Lactic Acid >11, pH 6.5, hypothermic.  UDS positive for cocaine/THC/amphetamines.  Patient admitted to ICU for mechanical ventilation and shock requiring vasopressors, as well as re-warming.  On vancomycin from 5/2-5/3, now on cefepime only from 5/2, treated for suspected pneumonia.  Of note, no medical history was not available on admission for patient, identity is now known.  Patient has two medical charts.  Mental status has been improving, still remains nonverbal.  Patient unable to provide information and no family available as identity is unknown.  Per chart review in Care Everywhere, patient last seen by PCP on 11/16/2019 for annual physical.  At that time, BUN 17, creatinine 1.03.  Trend in Creatinine: Creatinine, Ser  Date/Time Value Ref Range Status  11/21/2019 04:38 AM 4.73 (H) 0.61 - 1.24 mg/dL Final  11/20/2019 06:14 PM 4.20 (H) 0.61 - 1.24 mg/dL Final  11/20/2019 04:44 AM 3.46 (H) 0.61 - 1.24 mg/dL Final  11/19/2019 08:35 PM 3.32 (H) 0.61 - 1.24 mg/dL Final  11/19/2019 05:30 PM 3.29 (H) 0.61 - 1.24 mg/dL Corrected  11/19/2019 11:14 AM 3.17 (H) 0.61 - 1.24 mg/dL Final  11/19/2019 05:05 AM 3.00 (H) 0.61 - 1.24 mg/dL Final  12/03/2019 11:47 PM 2.97 (H) 0.61 - 1.24 mg/dL Final  12/16/2019 03:30 PM 3.13 (H) 0.61 - 1.24 mg/dL Final  12/03/2019 10:46 AM 3.00 (H) 0.61 - 1.24 mg/dL Final  12/05/2019 10:46 AM 3.00 (H) 0.61 - 1.24 mg/dL Final  12/17/2019 10:07 AM 3.67 (H) 0.61 - 1.24 mg/dL Final    PMH:  COPD, Tobacco Abuse, Remote Cocaine Abuse  PSH: Shoulder arthroscopy  Allergies:  Morphine-itching  Medications: BuSpar 10 mg, Voltaren gel, mirtazapine 30 mg nightly, naproxen, Paxil 40 mg daily  Inpatient medications: . artificial tears  1 application Both Eyes Z6X  . chlorhexidine gluconate (MEDLINE KIT)  15 mL Mouth Rinse BID  . Chlorhexidine Gluconate Cloth  6 each Topical Daily  . feeding supplement (PRO-STAT SUGAR FREE 64)  30 mL Per Tube BID  . feeding supplement (VITAL HIGH PROTEIN)  1,000 mL Per Tube Q24H  . folic acid  1 mg Oral Daily  . heparin  5,000 Units Subcutaneous Q8H  . hydrocortisone sod succinate (SOLU-CORTEF) inj  50 mg Intravenous Q6H  . insulin aspart  0-6 Units Subcutaneous Q4H  . ipratropium-albuterol  3 mL Nebulization Q6H  . mouth rinse  15 mL Mouth Rinse 10 times per day  . multivitamin with minerals  1 tablet Per Tube Daily  . pantoprazole sodium  40 mg Per Tube Daily  . sodium chloride flush  10-40 mL Intracatheter Q12H  . thiamine  100 mg Oral Daily    Discontinued Meds:   Medications Discontinued During This Encounter  Medication Reason  . aspirin suppository 096 mg Duplicate  . midazolam (VERSED) 2 MG/2ML injection Returned to ADS  . 0.9 %  sodium chloride infusion Duplicate  . insulin aspart (novoLOG) injection 0-9 Units   . insulin regular, human (MYXREDLIN) 100 units/ 100 mL infusion   . 0.9 %  sodium chloride infusion   . dextrose 5 %-  0.45 % sodium chloride infusion   . dextrose 50 % solution 0-50 mL   . EPINEPHrine (ADRENALIN) 4 mg in NS 250 mL (0.016 mg/mL) premix infusion   . sodium bicarbonate 150 mEq in dextrose 5% 1000 mL infusion   . pantoprazole (PROTONIX) injection 40 mg   . lactated ringers infusion   . norepinephrine (LEVOPHED) 84m in 2580mpremix infusion   . insulin regular, human (MYXREDLIN) 100 units/ 100 mL infusion   . dextrose 5 %-0.45 % sodium chloride infusion   . dextrose 50 % solution 0-50 mL   . sodium bicarbonate 150 mEq in sterile water 1,000 mL infusion   . phenylephrine (NEOSYNEPHRINE)  10-0.9 MG/250ML-% infusion   . potassium chloride (KLOR-CON) packet 40 mEq   . vancomycin (VANCOREADY) IVPB 750 mg/150 mL   . fentaNYL (SUBLIMAZE) injection 100 mcg   . fentaNYL 250046min NS 250m24m0mc68m) infusion-PREMIX   . propofol (DIPRIVAN) 1000 MG/100ML infusion   . phenylephrine CONCENTRATED 100mg 75modium chloride 0.9% 250mL (11mg/mL)43mfusion   . ceFEPIme (MAXIPIME) 2 g in sodium chloride 0.9 % 100 mL IVPB     Social History:  has no history on file for tobacco, alcohol, and drug.  Family History:  No family history on file.  Review of systems not obtained due to patient factors.  Patient nonverbal at present. Weight change: 7.2 kg  Intake/Output Summary (Last 24 hours) at 11/21/2019 1035 Last data filed at 11/21/2019 0700 Gross per 24 hour  Intake 3483.59 ml  Output 55 ml  Net 3428.59 ml   BP (!) 97/57   Pulse (!) 105   Temp 98.8 F (37.1 C) (Bladder)   Resp (!) 25   Ht 6' (1.829 m)   Wt 70.7 kg   SpO2 98%   BMI 21.15 kg/m  Vitals:   11/21/19 0615 11/21/19 0645 11/21/19 0700 11/21/19 0745  BP:   101/70 (!) 97/57  Pulse:    (!) 105  Resp: (!) 22 (!) 22 (!) 25 (!) 25  Temp:      TempSrc:      SpO2: 97% 97% 96% 98%  Weight:      Height:         Physical Exam:  General: 48 y.o. 33le intubated, eyes closed, does not open them, nonverbal, withdraws to painful stimuli, moves all four extremities HEENT: ETT in place Cardio: RRR Lungs: Diminished breath sounds throughout, ventilated breath sounds Abdomen: Soft, no masses palpated Skin: warm and dry Extremities: No edema of BLE, trace pitting edema at hips  Neuro: withdraws to painful stimuli, nonverbal  Labs: Basic Metabolic Panel: Recent Labs  Lab 11/22/2019 1007 11/30/2019 1044 12/10/2019 2347 12/07/2019 2347 11/19/19 0505 11/19/19 0532 11/19/19 1114 11/19/19 1114 11/19/19 1730 11/19/19 1730 11/19/19 2035 11/19/19 2035 11/19/19 2342 11/20/19 0214 11/20/19 0444 11/20/19 1529 11/20/19 1814  11/21/19 0431 11/21/19 0438  NA 146*   < > 140   < > 141   < > 142   < > 144   < > 143   < > 141 141 141 140 140 140 142  K 5.1   < > 3.7   < > 3.1*   < > 2.6*   < > 3.0*   < > 3.9   < > 3.6 3.4* 3.8 5.1 5.1 5.7* 5.9*  CL 109   < > 102   < > 101  --  99  --  97*  --  95*  --   --   --  100  --  102  --  101  CO2 <7*   < > 17*  --  20*  --  23  --  22  --  26  --   --   --  27  --   --   --  24  GLUCOSE 66*   < > 250*   < > 229*  --  168*  --  102*  --  90  --   --   --  61*  --  120*  --  135*  BUN 31*   < > 38*   < > 40*  --  43*  --  46*  --  48*  --   --   --  47*  --  51*  --  70*  CREATININE 3.67*   < > 2.97*   < > 3.00*  --  3.17*  --  3.29*  --  3.32*  --   --   --  3.46*  --  4.20*  --  4.73*  ALBUMIN 3.2*  --   --   --  2.4*  --   --   --   --   --   --   --   --   --  2.0*  --   --   --  1.8*  CALCIUM 8.9   < > 7.4*  --  6.7*  --  7.4*  --  6.7*  --  6.6*  --   --   --  6.6*  --   --   --  6.3*  PHOS  --   --   --   --  7.6*  --   --   --   --   --   --   --   --   --   --   --   --   --  4.8*   < > = values in this interval not displayed.   Liver Function Tests: Recent Labs  Lab 11/19/19 0505 11/20/19 0444 11/21/19 0438  AST 4,857* 2,022* 1,367*  ALT 2,635* 1,752* 1,229*  ALKPHOS 61 73 92  BILITOT 1.3* 1.9* 1.7*  PROT 4.1* 3.9* 4.2*  ALBUMIN 2.4* 2.0* 1.8*   No results for input(s): LIPASE, AMYLASE in the last 168 hours. No results for input(s): AMMONIA in the last 168 hours. CBC: Recent Labs  Lab 12/02/2019 1007 12/05/2019 1044 11/19/19 0657 11/19/19 2342 11/20/19 0444 11/20/19 0444 11/20/19 1529 11/20/19 1814 11/21/19 0431 11/21/19 0438  WBC 17.9*  --  2.0*  --  7.4  --   --   --   --  14.9*  HGB 13.7   < > 13.6   < > 13.8   < > 13.3 12.9* 11.9* 12.8*  HCT 45.9   < > 37.1*   < > 37.6*   < > 39.0 38.0* 35.0* 36.4*  MCV 108.5*  --  89.2  --  88.1  --   --   --   --  89.9  PLT 160  --  52*  --  76*  --   --   --   --  60*   < > = values in this interval not  displayed.   PT/INR: @LABRCNTIP (inr:5) Cardiac Enzymes: ) Recent Labs  Lab 11/27/2019 1530 11/19/19 0505 11/20/19 0952  CKTOTAL 9,230* 18,036* 18,651*   CBG: Recent Labs  Lab 11/20/19 1556 11/20/19 2041 11/21/19 0011 11/21/19 9201 11/21/19  0919  GLUCAP 115* 112* 122* 127* 134*    Iron Studies: No results for input(s): IRON, TIBC, TRANSFERRIN, FERRITIN in the last 168 hours.  Xrays/Other Studies: US Abdomen Complete  Result Date: 11/19/2019 CLINICAL DATA:  Renal failure, liver failure EXAM: ABDOMEN ULTRASOUND COMPLETE COMPARISON:  None. FINDINGS: Gallbladder: No gallstones or wall thickening visualized. No sonographic Murphy sign noted by sonographer. Small amount of pericholecystic fluid. Common bile duct: Diameter: Normal caliber, 6 mm Liver: No focal lesion identified. Within normal limits in parenchymal echogenicity. Portal vein is patent on color Doppler imaging with normal direction of blood flow towards the liver. IVC: No abnormality visualized. Pancreas: Visualized portion unremarkable. Spleen: Size and appearance within normal limits. Right Kidney: Length: 11.1 cm. No hydronephrosis. Mildly increased echotexture. Left Kidney: Length: 12.2 cm. No hydronephrosis. Mildly increased echotexture. Abdominal aorta: No aneurysm visualized. Other findings: Small amount of perihepatic ascites. IMPRESSION: Small amount of perihepatic and pericholecystic ascites. Increased echotexture in the kidneys suggesting chronic medical renal disease. Electronically Signed   By: Rolm Baptise M.D.   On: 11/19/2019 17:35   DG CHEST PORT 1 VIEW  Result Date: 11/21/2019 CLINICAL DATA:  Line placement EXAM: PORTABLE CHEST 1 VIEW COMPARISON:  Portable exam 0845 hours compared to 0514 hours FINDINGS: Endotracheal tube, nasogastric tube and RIGHT jugular line unchanged. New LEFT jugular dual-lumen central venous catheter with tip projecting over RIGHT atrium. Stable heart size, mediastinal contours and  pulmonary vascularity. Persistent bibasilar infiltrates. No pleural effusion or pneumothorax. IMPRESSION: No pneumothorax following LEFT jugular line placement. Otherwise no change. Electronically Signed   By: Lavonia Dana M.D.   On: 11/21/2019 09:31   DG Chest Port 1 View  Result Date: 11/21/2019 CLINICAL DATA:  Intubation, respiratory failure EXAM: PORTABLE CHEST 1 VIEW COMPARISON:  Portable exam 0514 hours compared to 11/19/2019 FINDINGS: Tip of endotracheal tube projects 8.4 cm above carina. Nasogastric tube extends into stomach. RIGHT jugular line with tip projecting over SVC. Normal heart size, mediastinal contours, and pulmonary vascularity. Mild bibasilar opacities which could represent infiltrate or aspiration. Remaining lungs clear. No pleural effusion or pneumothorax. IMPRESSION: Bibasilar opacities question infiltrate versus aspiration. Electronically Signed   By: Lavonia Dana M.D.   On: 11/21/2019 09:30     Assessment/Plan: 1.  AKI: Likely 2/2 hypoperfusion in the setting of shock as well as rhabdomyolysis.  Baseline creatinine appears to be 1.  Cr trend continues to rise, patient initially acidotic but has improved.  UOP has been decreasing, Initially on 5/2, was 730, 5/3 was 1232, 5/4 was 60.  Patient has been receiving D5NS at 75 cc/hr and currently also on levophed for BP support. Cr 3.67 on admission, decreased to 2.97, but has since progressively increased, now at 4.73.  BUN also progressively increasing fom 31>70.  CK elevated to 18,651, rhabdo also likely contributing.  Appears euvolemic on examination. 1. Start CRRT today, will keep fluid status stable. 2. HD catheter placed by CCM. 2. AMS: Initial EEG with encephalopathic changes.  Now with improvement on LTM.  Overall mental status also improving per notes, although patient remains nonverbal.  Suspect CRRT may also improve this as uremia is likely contributing. 3. Acute hypoxemic respiratory failure secondary to cardiac arrest:  Currently intubated on mechanical ventilation.  Per CCM. 4. Suspected aspiration pneumonia: Currently on cefepime (5/2- ).  S/p vancomycin 5/2-5/3.  Management per primary. 5. Shock: Per CCM.  Currently on levophed.  AST/ALT also elevated with increase in troponin.  Likely main source of problem #1. 6.  Rhabdomyolysis- will follow serial CK levels with CRRT 7. Hyperkalemia: K 5.9, s/p lokelma and calcium gluconate.  Plan per above, CRRT today.   8. Protein Calorie Malnutrition: Albumin 1.8. 9. Normocytic anemia: Hgb 13.7 on admission, now 12.8.  Likely dilutional effect with fluids.   Continue to monitor.    10. Thrombocytopenia: Plt 60, down from 160 on admission. 11. Drug Abuse: UDS positive for cocaine, THC, amphetamines. Will need counseling   Cleophas Dunker 11/21/2019, 10:35 AM   I have seen and examined this patient and agree with plan and assessment in the above note with renal recommendations/intervention highlighted.  Pt with cardiac arrest and shock/cardiogenic.  Intubated and sedated with worsening ARF, hyperkalemia, and acidosis as well as rhabdomyolysis.  Has been anuric and will initiate CRRT to help resolve electrolyte abnormalities.  Keep even and no heparin with CRRT due to thrombocytopenia.  Broadus John A Dominic Rhome,MD 11/21/2019 2:18 PM

## 2019-11-21 NOTE — Progress Notes (Signed)
CRITICAL VALUE ALERT  Critical Value:  Calcium 6.4 and K 6.4   Date & Time Notied:  11/21/19 1450  Provider Notified: Dr. Arrie Aran   Orders Received/Actions taken: Orders received to change dialysate bath to 0K

## 2019-11-21 NOTE — Progress Notes (Signed)
Contacted Mr Players PCP, they had a phone number for a mother, Philis Fendt, [phone].  She and her daughter are on their way to the hospital.   Lonia Blood, RN

## 2019-11-21 NOTE — Progress Notes (Signed)
Located Lawrence Dennis's chart in [facility], Photo matches the patient.  Placed call to the [facility] listed in chart, Shamarr Faucett, at [phone], the person who answered the call said that I had the wrong number when I asked for [facility].    Lonia Blood, RN

## 2019-11-21 NOTE — Progress Notes (Signed)
Assisted tele visit to patient with family member.  Cuyler Vandyken Ann, RN  

## 2019-11-21 NOTE — Procedures (Signed)
Hemodialysis Catheter Insertion Procedure Note [name] [id number] 07/19/1876  Procedure: Insertion of Hemodialysis Catheter Indications: Dialysis Access   Procedure Details Consent: Unable to obtain consent because of altered level of consciousness. Time Out: Verified patient identification, verified procedure, site/side was marked, verified correct patient position, special equipment/implants available, medications/allergies/relevent history reviewed, required imaging and test results available.  Performed  Maximum sterile technique was used including antiseptics, cap, gloves, gown, hand hygiene, mask and sheet. Skin prep: Chlorhexidine; local anesthetic administered Double lumen hemodialysis catheter was inserted into left internal jugular vein using the Seldinger technique.  Evaluation Blood flow good Complications: No apparent complications Patient did tolerate procedure well. Chest X-ray ordered to verify placement.  CXR: normal.   Raylei Losurdo 11/21/2019

## 2019-11-21 NOTE — Progress Notes (Signed)
NAME:  Lawrence Dennis, MRN:  [id number], DOB:  07/19/1876, LOS: 3 ADMISSION DATE:  Dec 06, 2019, CONSULTATION DATE:  12-06-2019 REFERRING MD:  Dr. Jeanell Sparrow, CHIEF COMPLAINT:  Arrest   Brief History   Lawrence Dennis presenting with PEA arrest.  History of present illness   Unknown age [place].  EMS called and as they were loading him into truck he lost pulse.  ~6 mins CPR before ROSC.  Poor mental status after ROSC.  CT head and CXR benign.  Initial lactate >11 and initial pH 6.5.  Utox cocaine/THC/amphetamines.  Past Medical History  Unknown  Significant Hospital Events   5/2 admitted  Consults:  PCCM  Procedures:  5/2: ETT>> R rad A line 5/2>>> R IJ CVL 5/2>>>  Significant Diagnostic Tests:  CT Head: no acute injury CXR: hyperinflation otherwise normal 2D echo 5/3>> EF 50-55%, mod AS abd u/s 5/3>>> Small amount of perihepatic and pericholecystic ascites. Increased echotexture in the kidneys suggesting chronic medical renal disease.  Micro Data:  COVID>>NEG  Antimicrobials:  5/2 Vanc >> 5/2 Cefepime >>   Interim history/subjective:  Weaning vasopressors overnight. Has started to open eyes to commands. Received Lokelma for hyperkalemia.  Objective   Blood pressure (!) 97/57, pulse (!) 105, temperature 98.8 F (37.1 C), temperature source Bladder, resp. rate (!) 25, height 6' (1.829 m), weight 70.7 kg, SpO2 98 %. CVP:  [4 mmHg-17 mmHg] 10 mmHg  Vent Mode: PCV FiO2 (%):  [40 %-60 %] 50 % Set Rate:  [25 bmp] 25 bmp PEEP:  [5 cmH20] 5 cmH20 Plateau Pressure:  [21 cmH20-23 cmH20] 21 cmH20   Intake/Output Summary (Last 24 hours) at 11/21/2019 0847 Last data filed at 11/21/2019 0700 Gross per 24 hour  Intake 3483.59 ml  Output 55 ml  Net 3428.59 ml   Filed Weights   11/20/19 0400 11/21/19 0000 11/21/19 0500  Weight: 63.5 kg 70.7 kg 70.7 kg    Examination: General: male pt, NAD, off all sedation.  HENT: ETT in place, pupils 34mm sluggish, mm  moist, no JVD  Lungs: resps even non labored on vent, normal breath sounds.  Cardiovascular: RRR, HS normal. Mottling of knees. Abdomen: incisional scars over RUQ/LUQ, soft, hypoactive BS Extremities: no sig edema Neuro: off sedation, no opening eyes weakly to voice. Not following commands in limbs.    Resolved Hospital Problem list   N/A  Assessment & Plan:   Critically ill due to witnessed out of hospital PEA arrest requiring TTM.  Short down time and immediate CPR - potential for reasonable neurological recovery.  Cause of arrest as yet unclear. -TTM completed. -Observe for neurological recovery.  Acute hypoxemic respiratory failure due to cardiac arrest requiring mechanical ventilation.  PLAN -  -SBT today   Possible aspiration pneumonia as suggested by copious secretions hyperinflation on CXR suggests underlying COPD. PLAN -  Continue empiric abx as above D2/x BD's  F/u CXR   Shock - mixed distributive and cardiogenic shock.  Improving likely secondary to combination of sedation and hypothermia.  PLAN -  Continue levophed, neo and titrate as able for MAP >65 Trend CVP  Wean propofol as able   Possible stress cardiomyopathy with type II myocardial injury Echo relatively normal   Acute kidney injury due to hypotension and possible drug ingestions. Associated hyperkalemia and oliguria.  Mild kidney echogenicity. Likely combination of hypotension and rhabdomyolysis. -Initiate CRRT for electrolyte correction.  May also help improve neurological status.  Shock liver with elevated transaminases. No  evidence of hepatic dysfunction.  Normal ultrasound. -Follow liver profile.  Thrombocytopenia of acute illness.  No bleeding diatheses at present. -Follow for now.  Daily Goals Checklist  Pain/Anxiety/Delirium protocol (if indicated): Sedation interrupted  VAP protocol (if indicated): Bundle in place.  Respiratory support goals: PRVC, Wean FiO2, SBT once rewarmed. Blood  pressure target: Titrate NE to keep MAP>65 DVT prophylaxis: Heparin 3 times daily Nutrition Status: High nutritional risk-advance feeds to goal GI prophylaxis: Pantoprazole Fluid status goals: Appears clinically euvolemic. Urinary catheter: Guide hemodynamic management Central lines: Triple-lumen central line, arterial line.  HD line. Glucose control: Currently euglycemic on sliding scale insulin..  On stress dose steroids. Mobility/therapy needs: Bedrest. Antibiotic de-escalation: Empiric vancomycin and cefepime. Home medication reconciliation: Unknown home medications. Daily labs: Daily CBC and CMP Code Status: Full code. Family Communication: Patient identified by fingerprints as  Lawrence Dennis. attempting to contact family. Disposition: ICU.   Labs   CBC: Recent Labs  Lab 11/23/2019 1007 11/23/2019 1044 11/19/19 0657 11/19/19 2342 11/20/19 0444 11/20/19 1529 11/20/19 1814 11/21/19 0431 11/21/19 0438  WBC 17.9*  --  2.0*  --  7.4  --   --   --  14.9*  HGB 13.7   < > 13.6   < > 13.8 13.3 12.9* 11.9* 12.8*  HCT 45.9   < > 37.1*   < > 37.6* 39.0 38.0* 35.0* 36.4*  MCV 108.5*  --  89.2  --  88.1  --   --   --  89.9  PLT 160  --  52*  --  76*  --   --   --  60*   < > = values in this interval not displayed.    Basic Metabolic Panel: Recent Labs  Lab 11/19/19 0505 11/19/19 0532 11/19/19 1114 11/19/19 1114 11/19/19 1730 11/19/19 1730 11/19/19 2035 11/19/19 2342 11/20/19 0444 11/20/19 1529 11/20/19 1814 11/21/19 0431 11/21/19 0438  NA 141   < > 142   < > 144   < > 143   < > 141 140 140 140 142  K 3.1*   < > 2.6*   < > 3.0*   < > 3.9   < > 3.8 5.1 5.1 5.7* 5.9*  CL 101   < > 99   < > 97*  --  95*  --  100  --  102  --  101  CO2 20*   < > 23  --  22  --  26  --  27  --   --   --  24  GLUCOSE 229*   < > 168*   < > 102*  --  90  --  61*  --  120*  --  135*  BUN 40*   < > 43*   < > 46*  --  48*  --  47*  --  51*  --  70*  CREATININE 3.00*   < > 3.17*   < > 3.29*   --  3.32*  --  3.46*  --  4.20*  --  4.73*  CALCIUM 6.7*   < > 7.4*  --  6.7*  --  6.6*  --  6.6*  --   --   --  6.3*  MG 2.9*  --   --   --   --   --   --   --   --   --   --   --  1.6*  PHOS 7.6*  --   --   --   --   --   --   --   --   --   --   --  4.8*   < > = values in this interval not displayed.   GFR: Estimated Creatinine Clearance: -0.8 mL/min (A) (by C-G formula based on SCr of 4.73 mg/dL (H)). Recent Labs  Lab 12/11/2019 1007 Nov 24, 2019 1007 12/13/2019 1530 Nov 24, 2019 1939 11/22/2019 2348 11/19/19 0505 11/19/19 0657 11/20/19 0444 11/20/19 1608 11/21/19 0438  PROCALCITON  --   --  52.51  --   --  70.01  --   --   --   --   WBC 17.9*  --   --   --   --   --  2.0* 7.4  --  14.9*  LATICACIDVEN >11.0*   < >  --  7.5* 7.7* 9.0*  --   --  4.8*  --    < > = values in this interval not displayed.    Liver Function Tests: Recent Labs  Lab Nov 24, 2019 1007 11/19/19 0505 11/20/19 0444 11/21/19 0438  AST 3,759* 4,857* 2,022* 1,367*  ALT 2,472* 2,635* 1,752* 1,229*  ALKPHOS 77 61 73 92  BILITOT 0.8 1.3* 1.9* 1.7*  PROT 5.5* 4.1* 3.9* 4.2*  ALBUMIN 3.2* 2.4* 2.0* 1.8*   No results for input(s): LIPASE, AMYLASE in the last 168 hours. No results for input(s): AMMONIA in the last 168 hours.  ABG    Component Value Date/Time   PHART 7.423 11/21/2019 0431   PCO2ART 40.4 11/21/2019 0431   PO2ART 90 11/21/2019 0431   HCO3 26.4 11/21/2019 0431   TCO2 28 11/21/2019 0431   ACIDBASEDEF 3.0 (H) 11/19/2019 0532   O2SAT 97.0 11/21/2019 0431     Coagulation Profile: Recent Labs  Lab 12/12/2019 1007 Nov 24, 2019 1747 24-Nov-2019 1939  INR 2.9* 1.9* 2.0*    Cardiac Enzymes: Recent Labs  Lab Nov 24, 2019 1530 11/19/19 0505 11/20/19 0952  CKTOTAL 9,230* 18,036* 18,651*    HbA1C: Hgb A1c MFr Bld  Date/Time Value Ref Range Status  11/23/2019 03:30 PM 5.3 4.8 - 5.6 % Final    Comment:    (NOTE) Pre diabetes:          5.7%-6.4% Diabetes:              >6.4% Glycemic control for    <7.0% adults with diabetes     CBG: Recent Labs  Lab 11/20/19 1122 11/20/19 1556 11/20/19 2041 11/21/19 0011 11/21/19 0437  GLUCAP 110* 115* 112* 122* 127*    CRITICAL CARE Performed by: Lynnell Catalan   Total critical care time: 40 minutes  Critical care time was exclusive of separately billable procedures and treating other patients.  Critical care was necessary to treat or prevent imminent or life-threatening deterioration.  Critical care was time spent personally by me on the following activities: development of treatment plan with patient and/or surrogate as well as nursing, discussions with consultants, evaluation of patient's response to treatment, examination of patient, obtaining history from patient or surrogate, ordering and performing treatments and interventions, ordering and review of laboratory studies, ordering and review of radiographic studies, pulse oximetry, re-evaluation of patient's condition and participation in multidisciplinary rounds.   Lynnell Catalan, MD [facility] ICU Physician [facility]  Critical Care  Pager: [phone] Mobile: [phone] After hours: [phone].  11/21/2019, 9:06 AM

## 2019-11-22 LAB — COMPREHENSIVE METABOLIC PANEL
ALT: 906 U/L — ABNORMAL HIGH (ref 0–44)
AST: 960 U/L — ABNORMAL HIGH (ref 15–41)
Albumin: 1.5 g/dL — ABNORMAL LOW (ref 3.5–5.0)
Alkaline Phosphatase: 101 U/L (ref 38–126)
Anion gap: 10 (ref 5–15)
BUN: 50 mg/dL — ABNORMAL HIGH (ref 6–20)
CO2: 25 mmol/L (ref 22–32)
Calcium: 6.7 mg/dL — ABNORMAL LOW (ref 8.9–10.3)
Chloride: 103 mmol/L (ref 98–111)
Creatinine, Ser: 2.93 mg/dL — ABNORMAL HIGH (ref 0.61–1.24)
GFR calc Af Amer: 28 mL/min — ABNORMAL LOW (ref >60)
GFR calc non Af Amer: 24 mL/min — ABNORMAL LOW (ref >60)
Glucose, Bld: 152 mg/dL — ABNORMAL HIGH (ref 70–99)
Potassium: 5.2 mmol/L — ABNORMAL HIGH (ref 3.5–5.1)
Sodium: 138 mmol/L (ref 135–145)
Total Bilirubin: 1.2 mg/dL (ref 0.3–1.2)
Total Protein: 4.4 g/dL — ABNORMAL LOW (ref 6.5–8.1)

## 2019-11-22 LAB — RENAL FUNCTION PANEL
Albumin: 1.7 g/dL — ABNORMAL LOW (ref 3.5–5.0)
Albumin: 1.8 g/dL — ABNORMAL LOW (ref 3.5–5.0)
Anion gap: 8 (ref 5–15)
Anion gap: 11 (ref 5–15)
BUN: 48 mg/dL — ABNORMAL HIGH (ref 6–20)
BUN: 46 mg/dL — ABNORMAL HIGH (ref 6–20)
CO2: 26 mmol/L (ref 22–32)
CO2: 27 mmol/L (ref 22–32)
Calcium: 6.8 mg/dL — ABNORMAL LOW (ref 8.9–10.3)
Calcium: 7.1 mg/dL — ABNORMAL LOW (ref 8.9–10.3)
Chloride: 103 mmol/L (ref 98–111)
Chloride: 100 mmol/L (ref 98–111)
Creatinine, Ser: 2.55 mg/dL — ABNORMAL HIGH (ref 0.61–1.24)
Creatinine, Ser: 2.24 mg/dL — ABNORMAL HIGH (ref 0.61–1.24)
GFR calc Af Amer: 33 mL/min — ABNORMAL LOW (ref >60)
GFR calc Af Amer: 38 mL/min — ABNORMAL LOW (ref >60)
GFR calc non Af Amer: 28 mL/min — ABNORMAL LOW (ref >60)
GFR calc non Af Amer: 33 mL/min — ABNORMAL LOW (ref >60)
Glucose, Bld: 118 mg/dL — ABNORMAL HIGH (ref 70–99)
Glucose, Bld: 106 mg/dL — ABNORMAL HIGH (ref 70–99)
Phosphorus: 4.8 mg/dL — ABNORMAL HIGH (ref 2.5–4.6)
Phosphorus: 3.9 mg/dL (ref 2.5–4.6)
Potassium: 5.4 mmol/L — ABNORMAL HIGH (ref 3.5–5.1)
Potassium: 5.3 mmol/L — ABNORMAL HIGH (ref 3.5–5.1)
Sodium: 137 mmol/L (ref 135–145)
Sodium: 138 mmol/L (ref 135–145)

## 2019-11-22 LAB — POCT I-STAT 7, (LYTES, BLD GAS, ICA,H+H)
Acid-Base Excess: 2 mmol/L (ref 0.0–2.0)
Bicarbonate: 26.5 mmol/L (ref 20.0–28.0)
Calcium, Ion: 0.97 mmol/L — ABNORMAL LOW (ref 1.15–1.40)
HCT: 31 % — ABNORMAL LOW (ref 39.0–52.0)
Hemoglobin: 10.5 g/dL — ABNORMAL LOW (ref 13.0–17.0)
O2 Saturation: 96 %
Patient temperature: 37.1
Potassium: 5.2 mmol/L — ABNORMAL HIGH (ref 3.5–5.1)
Sodium: 139 mmol/L (ref 135–145)
TCO2: 28 mmol/L (ref 22–32)
pCO2 arterial: 40.8 mmHg (ref 32.0–48.0)
pH, Arterial: 7.421 (ref 7.350–7.450)
pO2, Arterial: 78 mmHg — ABNORMAL LOW (ref 83.0–108.0)

## 2019-11-22 LAB — CBC
HCT: 30.8 % — ABNORMAL LOW (ref 39.0–52.0)
Hemoglobin: 10.7 g/dL — ABNORMAL LOW (ref 13.0–17.0)
MCH: 32 pg (ref 26.0–34.0)
MCHC: 34.7 g/dL (ref 30.0–36.0)
MCV: 92.2 fL (ref 80.0–100.0)
Platelets: 30 10*3/uL — ABNORMAL LOW (ref 150–400)
RBC: 3.34 MIL/uL — ABNORMAL LOW (ref 4.22–5.81)
RDW: 13.6 % (ref 11.5–15.5)
WBC: 7.9 10*3/uL (ref 4.0–10.5)
nRBC: 0 % (ref 0.0–0.2)

## 2019-11-22 LAB — GLUCOSE, CAPILLARY
Glucose-Capillary: 144 mg/dL — ABNORMAL HIGH (ref 70–99)
Glucose-Capillary: 105 mg/dL — ABNORMAL HIGH (ref 70–99)
Glucose-Capillary: 118 mg/dL — ABNORMAL HIGH (ref 70–99)
Glucose-Capillary: 97 mg/dL (ref 70–99)
Glucose-Capillary: 80 mg/dL (ref 70–99)
Glucose-Capillary: 82 mg/dL (ref 70–99)

## 2019-11-22 LAB — MAGNESIUM: Magnesium: 2.1 mg/dL (ref 1.7–2.4)

## 2019-11-22 MED ORDER — B COMPLEX-C PO TABS
1.0000 | ORAL_TABLET | Freq: Every day | ORAL | Status: DC
  Administered 2019-11-22 – 2019-11-23 (×2): 1 via ORAL
  Filled 2019-11-22 (×3): qty 1

## 2019-11-22 MED ORDER — IPRATROPIUM-ALBUTEROL 0.5-2.5 (3) MG/3ML IN SOLN: 3.0000 mL | Freq: Four times a day (QID) | RESPIRATORY_TRACT | Status: DC | PRN

## 2019-11-22 MED ORDER — VITAL AF 1.2 CAL PO LIQD
1000.0000 mL | ORAL | Status: DC
  Administered 2019-11-22: 1000 mL

## 2019-11-22 NOTE — Progress Notes (Signed)
CPT thru the bed not performed this round. Family meeting in progress. RT will continue to monitor.
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
## 2019-11-22 NOTE — Progress Notes (Signed)
Assisted tele visit to patient with family member.  Aleida Crandell M, RN   

## 2019-11-22 NOTE — Plan of Care (Signed)
  Problem: Cardiac: Goal: Ability to achieve and maintain adequate cardiopulmonary perfusion will improve Outcome: Progressing   Problem: Clinical Measurements: Goal: Ability to maintain clinical measurements within normal limits will improve Outcome: Progressing Goal: Will remain free from infection Outcome: Progressing Goal: Diagnostic test results will improve Outcome: Progressing Goal: Respiratory complications will improve Outcome: Progressing Goal: Cardiovascular complication will be avoided Outcome: Progressing   Problem: Nutrition: Goal: Adequate nutrition will be maintained Outcome: Progressing

## 2019-11-22 NOTE — Progress Notes (Signed)
Patient ID: Bucky Grigg Aiello, male   DOB: 03-01-1971, 49 y.o.   MRN: [id number] S:Tolerated CRRT overnight and is off of pressors O:BP 104/67   Pulse (!) 108   Temp 98.1 F (36.7 C) (Oral)   Resp 10   Ht 6' (1.829 m)   Wt 70.7 kg   SpO2 96%   BMI 21.15 kg/m   Intake/Output Summary (Last 24 hours) at 11/22/2019 0840 Last data filed at 11/22/2019 0800 Gross per 24 hour  Intake 3387.2 ml  Output 2806 ml  Net 581.2 ml   Intake/Output: I/O last 3 completed shifts: In: 5323.4 [I.V.:3616.8; NG/GT:1212; IV Piggyback:494.6] Out: 2775 [Urine:65; Other:2710]  Intake/Output this shift:  Total I/O In: 88.4 [I.V.:48.4; NG/GT:40] Out: 76 [Other:76] Weight change:  Gen: intubated, opens eyes spontaneously CVS: tachy Resp: scattered rhonchi Abd: +BS, soft, ND Ext: mottled appearance of feet/legs  Recent Labs  Lab 11/17/2019 1007 12/04/2019 1044 11/19/19 0505 11/19/19 0532 11/19/19 2035 11/19/19 2342 11/20/19 0444 11/20/19 1529 11/20/19 1814 11/20/19 1814 11/21/19 0431 11/21/19 0438 11/21/19 1300 11/21/19 1555 11/21/19 1800 11/22/19 0400 11/22/19 0415  NA 146*   < > 141   < > 143   < > 141   < > 140   < > 140 142 141 141 141 138 139  K 5.1   < > 3.1*   < > 3.9   < > 3.8   < > 5.1   < > 5.7* 5.9* 6.4* 5.9* 6.0* 5.2* 5.2*  CL 109   < > 101   < > 95*   < > 100  --  102  --   --  101 102 105 103 103  --   CO2 <7*   < > 20*   < > 26  --  27  --   --   --   --  24 24 24 24 25   --   GLUCOSE 66*   < > 229*   < > 90   < > 61*  --  120*  --   --  135* 142* 133* 145* 152*  --   BUN 31*   < > 40*   < > 48*   < > 47*  --  51*  --   --  70* 72* 64* 58* 50*  --   CREATININE 3.67*   < > 3.00*   < > 3.32*   < > 3.46*  --  4.20*  --   --  4.73* 4.43* 3.99* 3.72* 2.93*  --   ALBUMIN 3.2*  --  2.4*  --   --   --  2.0*  --   --   --   --  1.8*  --  1.7*  --  1.5*  --   CALCIUM 8.9   < > 6.7*   < > 6.6*  --  6.6*  --   --   --   --  6.3* 6.4* 6.5* 6.6* 6.7*  --   PHOS  --   --  7.6*  --   --   --    --   --   --   --   --  4.8*  --  5.1*  --   --   --   AST 5,885*  --  4,857*  --   --   --  2,022*  --   --   --   --  1,367*  --   --   --  960*  --   ALT 2,472*  --  2,635*  --   --   --  1,752*  --   --   --   --  1,229*  --   --   --  906*  --    < > = values in this interval not displayed.   Liver Function Tests: Recent Labs  Lab 11/20/19 0444 11/20/19 0444 11/21/19 0438 11/21/19 1555 11/22/19 0400  AST 2,022*  --  1,367*  --  960*  ALT 1,752*  --  1,229*  --  906*  ALKPHOS 73  --  92  --  101  BILITOT 1.9*  --  1.7*  --  1.2  PROT 3.9*  --  4.2*  --  4.4*  ALBUMIN 2.0*   < > 1.8* 1.7* 1.5*   < > = values in this interval not displayed.   No results for input(s): LIPASE, AMYLASE in the last 168 hours. No results for input(s): AMMONIA in the last 168 hours. CBC: Recent Labs  Lab 12/10/2019 1007 11/19/2019 1044 11/19/19 0657 11/19/19 2342 11/20/19 0444 11/20/19 1529 11/21/19 0438 11/22/19 0400 11/22/19 0415  WBC 17.9*   < > 2.0*  --  7.4  --  14.9* 7.9  --   HGB 13.7   < > 13.6   < > 13.8   < > 12.8* 10.7* 10.5*  HCT 45.9   < > 37.1*   < > 37.6*   < > 36.4* 30.8* 31.0*  MCV 108.5*  --  89.2  --  88.1  --  89.9 92.2  --   PLT 160   < > 52*  --  76*  --  60* 30*  --    < > = values in this interval not displayed.   Cardiac Enzymes: Recent Labs  Lab 12/13/2019 1530 11/19/19 0505 11/20/19 0952  CKTOTAL 9,230* 18,036* 18,651*   CBG: Recent Labs  Lab 11/21/19 1507 11/21/19 2022 11/21/19 2344 11/22/19 0344 11/22/19 0801  GLUCAP 116* 121* 154* 144* 105*    Iron Studies: No results for input(s): IRON, TIBC, TRANSFERRIN, FERRITIN in the last 72 hours. Studies/Results: DG CHEST PORT 1 VIEW  Result Date: 11/21/2019 CLINICAL DATA:  Line placement EXAM: PORTABLE CHEST 1 VIEW COMPARISON:  Portable exam 0845 hours compared to 0514 hours FINDINGS: Endotracheal tube, nasogastric tube and RIGHT jugular line unchanged. New LEFT jugular dual-lumen central venous catheter with  tip projecting over RIGHT atrium. Stable heart size, mediastinal contours and pulmonary vascularity. Persistent bibasilar infiltrates. No pleural effusion or pneumothorax. IMPRESSION: No pneumothorax following LEFT jugular line placement. Otherwise no change. Electronically Signed   By: Lavonia Dana M.D.   On: 11/21/2019 09:31   DG Chest Port 1 View  Result Date: 11/21/2019 CLINICAL DATA:  Intubation, respiratory failure EXAM: PORTABLE CHEST 1 VIEW COMPARISON:  Portable exam 0514 hours compared to 11/19/2019 FINDINGS: Tip of endotracheal tube projects 8.4 cm above carina. Nasogastric tube extends into stomach. RIGHT jugular line with tip projecting over SVC. Normal heart size, mediastinal contours, and pulmonary vascularity. Mild bibasilar opacities which could represent infiltrate or aspiration. Remaining lungs clear. No pleural effusion or pneumothorax. IMPRESSION: Bibasilar opacities question infiltrate versus aspiration. Electronically Signed   By: Lavonia Dana M.D.   On: 11/21/2019 09:30   . chlorhexidine gluconate (MEDLINE KIT)  15 mL Mouth Rinse BID  . Chlorhexidine Gluconate Cloth  6 each Topical Daily  . feeding supplement (PRO-STAT SUGAR FREE 64)  30 mL Per  Tube Daily  . folic acid  1 mg Oral Daily  . heparin  5,000 Units Subcutaneous Q8H  . insulin aspart  0-6 Units Subcutaneous Q4H  . mouth rinse  15 mL Mouth Rinse 10 times per day  . multivitamin with minerals  1 tablet Per Tube Daily  . pantoprazole sodium  40 mg Per Tube Daily  . sodium chloride flush  10-40 mL Intracatheter Q12H  . thiamine  100 mg Oral Daily    BMET    Component Value Date/Time   NA 139 11/22/2019 0415   K 5.2 (H) 11/22/2019 0415   CL 103 11/22/2019 0400   CO2 25 11/22/2019 0400   GLUCOSE 152 (H) 11/22/2019 0400   BUN 50 (H) 11/22/2019 0400   CREATININE 2.93 (H) 11/22/2019 0400   CALCIUM 6.7 (L) 11/22/2019 0400   GFRNONAA 24 (L) 11/22/2019 0400   GFRAA 28 (L) 11/22/2019 0400   CBC    Component Value  Date/Time   WBC 7.9 11/22/2019 0400   RBC 3.34 (L) 11/22/2019 0400   HGB 10.5 (L) 11/22/2019 0415   HCT 31.0 (L) 11/22/2019 0415   PLT 30 (L) 11/22/2019 0400   MCV 92.2 11/22/2019 0400   MCH 32.0 11/22/2019 0400   MCHC 34.7 11/22/2019 0400   RDW 13.6 11/22/2019 0400    Assessment/Plan: 1.  AKI: Likely 2/2 hypoperfusion in the setting of shock as well as rhabdomyolysis.  Baseline creatinine appears to be 1.  Cr trend continues to rise, patient initially acidotic but has improved.  UOP has been decreasing, Initially on 5/2, was 730, 5/3 was 1232, 5/4 was 60.  Patient has been receiving D5NS at 75 cc/hr and currently also on levophed for BP support. Cr 3.67 on admission, decreased to 2.97, but has since progressively increased, now at 4.73.  BUN also progressively increasing fom 31>70.  CK elevated to 18,651, rhabdo also likely contributing.  Appears euvolemic on examination. 1. Started CRRT 11/21/19, will try to UF but TMP alarming.  May need new machine if continues to be an issue 2. HD catheter placed by CCM 11/21/19 and appears to be functioning well. 2. AMS: Initial EEG with encephalopathic changes.  Now with improvement on LTM.  Overall mental status also improving per notes, although patient is not following commands 3. Acute hypoxemic respiratory failure secondary to cardiac arrest: Currently intubated on mechanical ventilation.  Per CCM. 4. Suspected aspiration pneumonia: Currently on cefepime (5/2- ).  S/p vancomycin 5/2-5/3.  Management per primary. 5. Shock: Per CCM.  Currently off levophed.  AST/ALT also elevated with increase in troponin.  Likely main source of problem #1. 6. Rhabdomyolysis- will follow serial CK levels with CRRT 7. Hyperkalemia: K 5.9, s/p lokelma and calcium gluconate.  Still with elevated potassium but improving with CRRT.   1. Using 2K bath for all fluids, if remains high will change dialysate to 0K 8. Protein Calorie Malnutrition: Albumin 1.8. 9. Normocytic  anemia: Hgb 13.7 on admission, now 10.5.  Likely dilutional effect with fluids.   Continue to monitor.    10. Thrombocytopenia: Plt 30, down from 160 on admission. 11. Drug Abuse: UDS positive for cocaine, THC, amphetamines. Will need counseling  Donetta Potts, MD [facility] [phone]

## 2019-11-22 NOTE — Progress Notes (Signed)
Verbal consent from Vikki Ports (wife) given for Lawrence Dennis (mother) to make all medical decisions for duration at patient's hospitalization. Consent was witnessed by Raymondo Band CSW, Alfredo Bach CSW and this RN.

## 2019-11-22 NOTE — Progress Notes (Signed)
Nutrition Follow-up  DOCUMENTATION CODES:   Severe malnutrition in context of chronic illness  INTERVENTION:   Tube Feeding:  Vital AF 1.2 at 60 ml/hr Provides 1728 kcals, 108 g of protein and 1166 mL of free water Meets 100% of estimated calorie and protein needs  Add B-complex with C   NUTRITION DIAGNOSIS:   Severe Malnutrition related to chronic illness as evidenced by severe muscle depletion, severe fat depletion.  Being addressed via TF   GOAL:   Patient will meet greater than or equal to 90% of their needs  Progressing  MONITOR:   Vent status, Labs, Weight trends, TF tolerance, Skin  REASON FOR ASSESSMENT:   Consult, Ventilator Enteral/tube feeding initiation and management  ASSESSMENT:   Lawrence Dennis (uknown age male) presenting post PEA arrest, acute respiratory failure. Unknown PMH. +urine tox or cocaine, THC, amphetamines   5/02 Admitted, Intubated, TTM 5/05 CRRT initiated  TTM complete, on CRRT  Patient is currently intubated on ventilator support MV: 9.3 L/min Temp (24hrs), Avg:98 F (36.7 C), Min:96.8 F (36 C), Max:98.6 F (37 C)  Propofol: OFF  Tolerating Vital High Protein infusing at 40 ml/hr, pro-Stat 30 mL daily via OG tube  Admit weight 61 kg; current weight 70.7 kg. Net +16 L per I/O flow sheet. Noted edema in all extremities  Mom at bedside and  indicating pt has been losing weight. Mom unable to indicate how well patient has been eating. Mom indicating pt had lost almost 10 pounds at MD appointment last week; weight loss over 1-2 months. RN indicating that the purpose of that MD appointment was to evaluate pt for unexplained weight loss.   Labs: potassium 5.4, phosphorus 4.8, Creatinine 2.55, BUN Meds: MVI with Minerals, ss novolog   Diet Order:   Diet Order    None      EDUCATION NEEDS:   Not appropriate for education at this time  Skin:  Skin Assessment: Reviewed RN Assessment  Last BM:  5/5  Height:   Ht Readings  from Last 1 Encounters:  11/19/19 6' (1.829 m)    Weight:   Wt Readings from Last 1 Encounters:  11/21/19 70.7 kg    BMI:  Body mass index is 21.15 kg/m.  Estimated Nutritional Needs:   Kcal:  1711 kcals  Protein:  92-122 g  Fluid:  >/= 1.8 L   Romelle Starcher MS, RDN, LDN, CNSC RD Pager Number and RD On-Call Pager Number Located in [place]

## 2019-11-22 NOTE — Progress Notes (Signed)
NAME:  Lawrence Dennis, MRN:  [id number], DOB:  01/02/1971, LOS: 4 ADMISSION DATE:  11/23/2019, CONSULTATION DATE:  11/28/2019 REFERRING MD:  Dr. Jeanell Sparrow, CHIEF COMPLAINT:  Arrest   Brief History   49 year old man presenting with PEA arrest. Initially a Lawrence Dennis.  History of present illness   Unknown age [place]  EMS called and as they were loading him into truck he lost pulse.  ~6 mins CPR before ROSC.  Poor mental status after ROSC.  CT head and CXR benign.  Initial lactate >11 and initial pH 6.5.  Utox cocaine/THC/amphetamines.  Past Medical History  Major depression and longstanding drug use.   Significant Hospital Events   5/2 admitted  Consults:  PCCM  Procedures:  5/2: ETT>> R rad A line 5/2>>> R IJ CVL 5/2>>>  Significant Diagnostic Tests:  CT Head: no acute injury CXR: hyperinflation otherwise normal 2D echo 5/3>> EF 50-55%, mod AS abd u/s 5/3>>> Small amount of perihepatic and pericholecystic ascites. Increased echotexture in the kidneys suggesting chronic medical renal disease.  Micro Data:  COVID>>NEG  Antimicrobials:  5/2 Vanc >> 5/2 Cefepime >>   Interim history/subjective:  Patient identified yesterday and visited by sister and mother. Estranged spouse has not yet seen the patient. Started on CRRT yesterday. Neurologically unchanged.   Objective   Blood pressure (!) 145/88, pulse (!) 106, temperature 98.6 F (37 C), temperature source Bladder, resp. rate 20, height 6' (1.829 m), weight 70.7 kg, SpO2 96 %. CVP:  [9 mmHg-21 mmHg] 15 mmHg  Vent Mode: PCV FiO2 (%):  [40 %-50 %] 40 % Set Rate:  [25 bmp] 25 bmp PEEP:  [5 cmH20] 5 cmH20 Plateau Pressure:  [21 cmH20-30 cmH20] 21 cmH20   Intake/Output Summary (Last 24 hours) at 11/22/2019 0732 Last data filed at 11/22/2019 0700 Gross per 24 hour  Intake 3428.38 ml  Output 2645 ml  Net 783.38 ml   Filed Weights   11/20/19 0400 11/21/19 0000 11/21/19 0500  Weight: 63.5 kg  70.7 kg 70.7 kg    Examination: General: male pt, NAD, off all sedation.  HENT: ETT/OGT in place, pupils 76mm sluggish, mm moist, no JVD  Lungs: Normal breath sounds, tolerating PSV 5/5 Cardiovascular: RRR, HS normal. Mottling of knees has improved. Now off NE. Abdomen: incisional scars over RUQ/LUQ, soft, hypoactive BS Extremities: no sig edema Neuro: off sedation, no opening eyes weakly to voice. Not following commands in limbs. Fine LE tremor consistent with shivering.   Resolved Hospital Problem list   N/A  Assessment & Plan:    Critically ill due to witnessed out of hospital PEA arrest requiring TTM.  Short down time and immediate CPR - potential for reasonable neurological recovery.  Cause of arrest as yet unclear. -TTM completed. -Observe for neurological recovery.  Acute hypoxemic respiratory failure due to cardiac arrest requiring mechanical ventilation.  PLAN -  -SBT today   Possible aspiration pneumonia as suggested by copious secretions hyperinflation on CXR suggests underlying COPD. PLAN -  Continue empiric abx as above D2/x BD's  F/u CXR   Shock - mixed distributive and cardiogenic shock. Now resolved.  Possible stress cardiomyopathy with type II myocardial injury - resolved. Echo relatively normal   Acute kidney injury due to hypotension and possible drug ingestions. Associated hyperkalemia and oliguria.  Mild kidney echogenicity. Likely combination of hypotension and rhabdomyolysis. -Initiate CRRT for electrolyte correction.  May also help improve neurological status. - Begin UF  Shock liver with  elevated transaminases. No evidence of hepatic dysfunction.  Normal ultrasound. -Follow liver profile.  Thrombocytopenia of acute illness.  No bleeding diatheses at present. -HITT panel..  Daily Goals Checklist  Pain/Anxiety/Delirium protocol (if indicated): keep off sedation.  VAP protocol (if indicated): Bundle in place.  Respiratory support goals: PRVC,  Wean FiO2, SBT daily. Blood pressure target: Titrate NE to keep MAP>65 DVT prophylaxis: Heparin 3 times daily Nutrition Status: High nutritional risk-advance feeds to goal GI prophylaxis: Pantoprazole Fluid status goals: Appears clinically euvolemic. Urinary catheter: Guide hemodynamic management Central lines: Triple-lumen central line, arterial line.  HD line. Glucose control: Currently euglycemic on sliding scale insulin.. Stop stress dose steroids. Mobility/therapy needs: Bedrest. Antibiotic de-escalation: Empiric vancomycin and cefepime -- will stop as cultures negative. Home medication reconciliation: Unknown home medications. Daily labs: Daily CBC and CMP Code Status: Full code. Family Communication: Patient identified by fingerprints as  Lawrence Dennis, Lawrence Dennis. attempting to contact family. Disposition: ICU.   Labs   CBC: Recent Labs  Lab 12/04/2019 1007 11/17/2019 1044 11/19/19 0657 11/19/19 2342 11/20/19 0444 11/20/19 1529 11/20/19 1814 11/21/19 0431 11/21/19 0438 11/22/19 0400 11/22/19 0415  WBC 17.9*  --  2.0*  --  7.4  --   --   --  14.9* 7.9  --   HGB 13.7   < > 13.6   < > 13.8   < > 12.9* 11.9* 12.8* 10.7* 10.5*  HCT 45.9   < > 37.1*   < > 37.6*   < > 38.0* 35.0* 36.4* 30.8* 31.0*  MCV 108.5*  --  89.2  --  88.1  --   --   --  89.9 92.2  --   PLT 160  --  52*  --  76*  --   --   --  60* 30*  --    < > = values in this interval not displayed.    Basic Metabolic Panel: Recent Labs  Lab 11/19/19 0505 11/19/19 0532 11/21/19 0438 11/21/19 0438 11/21/19 1300 11/21/19 1555 11/21/19 1800 11/22/19 0400 11/22/19 0415  NA 141   < > 142   < > 141 141 141 138 139  K 3.1*   < > 5.9*   < > 6.4* 5.9* 6.0* 5.2* 5.2*  CL 101   < > 101  --  102 105 103 103  --   CO2 20*   < > 24  --  24 24 24 25   --   GLUCOSE 229*   < > 135*  --  142* 133* 145* 152*  --   BUN 40*   < > 70*  --  72* 64* 58* 50*  --   CREATININE 3.00*   < > 4.73*  --  4.43* 3.99* 3.72* 2.93*  --    CALCIUM 6.7*   < > 6.3*  --  6.4* 6.5* 6.6* 6.7*  --   MG 2.9*  --  1.6*  --   --   --   --  2.1  --   PHOS 7.6*  --  4.8*  --   --  5.1*  --   --   --    < > = values in this interval not displayed.   GFR: Estimated Creatinine Clearance: 30.5 mL/min (A) (by C-G formula based on SCr of 2.93 mg/dL (H)). Recent Labs  Lab 11/29/2019 1007 12/10/2019 1530 12/09/2019 1939 11/26/2019 2348 11/19/19 0505 11/19/19 01/19/20 11/20/19 0444 11/20/19 1608 11/21/19 0438 11/22/19 0400  PROCALCITON  --  52.51  --   --  70.01  --   --   --   --   --   WBC   < >  --   --   --   --  2.0* 7.4  --  14.9* 7.9  LATICACIDVEN   < >  --  7.5* 7.7* 9.0*  --   --  4.8*  --   --    < > = values in this interval not displayed.    Liver Function Tests: Recent Labs  Lab 2019-12-03 1007 Dec 03, 2019 1007 11/19/19 0505 11/20/19 0444 11/21/19 0438 11/21/19 1555 11/22/19 0400  AST 3,759*  --  4,857* 2,022* 1,367*  --  960*  ALT 2,472*  --  2,635* 1,752* 1,229*  --  906*  ALKPHOS 77  --  61 73 92  --  101  BILITOT 0.8  --  1.3* 1.9* 1.7*  --  1.2  PROT 5.5*  --  4.1* 3.9* 4.2*  --  4.4*  ALBUMIN 3.2*   < > 2.4* 2.0* 1.8* 1.7* 1.5*   < > = values in this interval not displayed.   No results for input(s): LIPASE, AMYLASE in the last 168 hours. No results for input(s): AMMONIA in the last 168 hours.  ABG    Component Value Date/Time   PHART 7.421 11/22/2019 0415   PCO2ART 40.8 11/22/2019 0415   PO2ART 78 (L) 11/22/2019 0415   HCO3 26.5 11/22/2019 0415   TCO2 28 11/22/2019 0415   ACIDBASEDEF 3.0 (H) 11/19/2019 0532   O2SAT 96.0 11/22/2019 0415     Coagulation Profile: Recent Labs  Lab 2019/12/03 1007 2019-12-03 1747 Dec 03, 2019 1939  INR 2.9* 1.9* 2.0*    Cardiac Enzymes: Recent Labs  Lab Dec 03, 2019 1530 11/19/19 0505 11/20/19 0952  CKTOTAL 9,230* 18,036* 18,651*    HbA1C: Hgb A1c MFr Bld  Date/Time Value Ref Range Status  12/03/19 03:30 PM 5.3 4.8 - 5.6 % Final    Comment:    (NOTE) Pre diabetes:           5.7%-6.4% Diabetes:              >6.4% Glycemic control for   <7.0% adults with diabetes     CBG: Recent Labs  Lab 11/21/19 1127 11/21/19 1507 11/21/19 2022 11/21/19 2344 11/22/19 0344  GLUCAP 138* 116* 121* 154* 144*    CRITICAL CARE Performed by: Lynnell Catalan   Total critical care time: 30 minutes  Critical care time was exclusive of separately billable procedures and treating other patients.  Critical care was necessary to treat or prevent imminent or life-threatening deterioration.  Critical care was time spent personally by me on the following activities: development of treatment plan with patient and/or surrogate as well as nursing, discussions with consultants, evaluation of patient's response to treatment, examination of patient, obtaining history from patient or surrogate, ordering and performing treatments and interventions, ordering and review of laboratory studies, ordering and review of radiographic studies, pulse oximetry, re-evaluation of patient's condition and participation in multidisciplinary rounds.   Lynnell Catalan, MD [facility] ICU Physician [facility]  Pager: [phone] Mobile: [phone] After hours: [phone].  11/22/2019, 7:32 AM

## 2019-11-23 ENCOUNTER — Inpatient Hospital Stay (HOSPITAL_COMMUNITY): Payer: Self-pay

## 2019-11-23 DIAGNOSIS — E43 Unspecified severe protein-calorie malnutrition: Secondary | ICD-10-CM | POA: Insufficient documentation

## 2019-11-23 DIAGNOSIS — K922 Gastrointestinal hemorrhage, unspecified: Secondary | ICD-10-CM

## 2019-11-23 LAB — RENAL FUNCTION PANEL
Albumin: 1.8 g/dL — ABNORMAL LOW (ref 3.5–5.0)
Albumin: 1.7 g/dL — ABNORMAL LOW (ref 3.5–5.0)
Anion gap: 11 (ref 5–15)
Anion gap: 13 (ref 5–15)
BUN: 40 mg/dL — ABNORMAL HIGH (ref 6–20)
BUN: 34 mg/dL — ABNORMAL HIGH (ref 6–20)
CO2: 27 mmol/L (ref 22–32)
CO2: 23 mmol/L (ref 22–32)
Calcium: 7.6 mg/dL — ABNORMAL LOW (ref 8.9–10.3)
Calcium: 6.7 mg/dL — ABNORMAL LOW (ref 8.9–10.3)
Chloride: 100 mmol/L (ref 98–111)
Chloride: 99 mmol/L (ref 98–111)
Creatinine, Ser: 1.92 mg/dL — ABNORMAL HIGH (ref 0.61–1.24)
Creatinine, Ser: 1.53 mg/dL — ABNORMAL HIGH (ref 0.61–1.24)
GFR calc Af Amer: 46 mL/min — ABNORMAL LOW (ref >60)
GFR calc Af Amer: >60 mL/min (ref >60)
GFR calc non Af Amer: 40 mL/min — ABNORMAL LOW (ref >60)
GFR calc non Af Amer: 53 mL/min — ABNORMAL LOW (ref >60)
Glucose, Bld: 92 mg/dL (ref 70–99)
Glucose, Bld: 93 mg/dL (ref 70–99)
Phosphorus: 1.9 mg/dL — ABNORMAL LOW (ref 2.5–4.6)
Phosphorus: 7.4 mg/dL — ABNORMAL HIGH (ref 2.5–4.6)
Potassium: 4.6 mmol/L (ref 3.5–5.1)
Potassium: 5 mmol/L (ref 3.5–5.1)
Sodium: 138 mmol/L (ref 135–145)
Sodium: 135 mmol/L (ref 135–145)

## 2019-11-23 LAB — HEPATIC FUNCTION PANEL
ALT: 746 U/L — ABNORMAL HIGH (ref 0–44)
AST: 851 U/L — ABNORMAL HIGH (ref 15–41)
Albumin: 1.8 g/dL — ABNORMAL LOW (ref 3.5–5.0)
Alkaline Phosphatase: 154 U/L — ABNORMAL HIGH (ref 38–126)
Bilirubin, Direct: 0.7 mg/dL — ABNORMAL HIGH (ref 0.0–0.2)
Indirect Bilirubin: 0.9 mg/dL (ref 0.3–0.9)
Total Bilirubin: 1.6 mg/dL — ABNORMAL HIGH (ref 0.3–1.2)
Total Protein: 5.1 g/dL — ABNORMAL LOW (ref 6.5–8.1)

## 2019-11-23 LAB — CBC WITH DIFFERENTIAL/PLATELET
Abs Immature Granulocytes: 0.03 10*3/uL (ref 0.00–0.07)
Abs Immature Granulocytes: 0.03 10*3/uL (ref 0.00–0.07)
Basophils Absolute: 0 10*3/uL (ref 0.0–0.1)
Basophils Absolute: 0 10*3/uL (ref 0.0–0.1)
Basophils Relative: 0 %
Basophils Relative: 0 %
Eosinophils Absolute: 0 10*3/uL (ref 0.0–0.5)
Eosinophils Absolute: 0 10*3/uL (ref 0.0–0.5)
Eosinophils Relative: 0 %
Eosinophils Relative: 0 %
HCT: 32.7 % — ABNORMAL LOW (ref 39.0–52.0)
HCT: 32.6 % — ABNORMAL LOW (ref 39.0–52.0)
Hemoglobin: 11.4 g/dL — ABNORMAL LOW (ref 13.0–17.0)
Hemoglobin: 11.5 g/dL — ABNORMAL LOW (ref 13.0–17.0)
Immature Granulocytes: 0 %
Immature Granulocytes: 0 %
Lymphocytes Relative: 4 %
Lymphocytes Relative: 4 %
Lymphs Abs: 0.2 10*3/uL — ABNORMAL LOW (ref 0.7–4.0)
Lymphs Abs: 0.3 10*3/uL — ABNORMAL LOW (ref 0.7–4.0)
MCH: 32.2 pg (ref 26.0–34.0)
MCH: 31.9 pg (ref 26.0–34.0)
MCHC: 34.9 g/dL (ref 30.0–36.0)
MCHC: 35.3 g/dL (ref 30.0–36.0)
MCV: 92.4 fL (ref 80.0–100.0)
MCV: 90.3 fL (ref 80.0–100.0)
Monocytes Absolute: 0.5 10*3/uL (ref 0.1–1.0)
Monocytes Absolute: 0.7 10*3/uL (ref 0.1–1.0)
Monocytes Relative: 8 %
Monocytes Relative: 9 %
Neutro Abs: 5.9 10*3/uL (ref 1.7–7.7)
Neutro Abs: 6.9 10*3/uL (ref 1.7–7.7)
Neutrophils Relative %: 88 %
Neutrophils Relative %: 87 %
Platelets: 35 10*3/uL — ABNORMAL LOW (ref 150–400)
Platelets: 21 10*3/uL — CL (ref 150–400)
RBC: 3.54 MIL/uL — ABNORMAL LOW (ref 4.22–5.81)
RBC: 3.61 MIL/uL — ABNORMAL LOW (ref 4.22–5.81)
RDW: 13.9 % (ref 11.5–15.5)
RDW: 13.9 % (ref 11.5–15.5)
WBC: 6.8 10*3/uL (ref 4.0–10.5)
WBC: 8 10*3/uL (ref 4.0–10.5)
nRBC: 0.3 % — ABNORMAL HIGH (ref 0.0–0.2)
nRBC: 0 % (ref 0.0–0.2)

## 2019-11-23 LAB — CULTURE, BLOOD (ROUTINE X 2)
Culture: NO GROWTH
Culture: NO GROWTH
Special Requests: ADEQUATE

## 2019-11-23 LAB — POCT I-STAT 7, (LYTES, BLD GAS, ICA,H+H)
Acid-Base Excess: 2 mmol/L (ref 0.0–2.0)
Bicarbonate: 25.8 mmol/L (ref 20.0–28.0)
Calcium, Ion: 0.98 mmol/L — ABNORMAL LOW (ref 1.15–1.40)
HCT: 31 % — ABNORMAL LOW (ref 39.0–52.0)
Hemoglobin: 10.5 g/dL — ABNORMAL LOW (ref 13.0–17.0)
O2 Saturation: 96 %
Patient temperature: 93.3
Potassium: 4.3 mmol/L (ref 3.5–5.1)
Sodium: 135 mmol/L (ref 135–145)
TCO2: 27 mmol/L (ref 22–32)
pCO2 arterial: 33.1 mmHg (ref 32.0–48.0)
pH, Arterial: 7.488 — ABNORMAL HIGH (ref 7.350–7.450)
pO2, Arterial: 63 mmHg — ABNORMAL LOW (ref 83.0–108.0)

## 2019-11-23 LAB — GLUCOSE, CAPILLARY
Glucose-Capillary: 101 mg/dL — ABNORMAL HIGH (ref 70–99)
Glucose-Capillary: 120 mg/dL — ABNORMAL HIGH (ref 70–99)
Glucose-Capillary: 67 mg/dL — ABNORMAL LOW (ref 70–99)
Glucose-Capillary: 76 mg/dL (ref 70–99)
Glucose-Capillary: 79 mg/dL (ref 70–99)
Glucose-Capillary: 81 mg/dL (ref 70–99)
Glucose-Capillary: 83 mg/dL (ref 70–99)
Glucose-Capillary: 87 mg/dL (ref 70–99)

## 2019-11-23 LAB — MASSIVE TRANSFUSION PROTOCOL ORDER (BLOOD BANK NOTIFICATION)

## 2019-11-23 LAB — HEPARIN INDUCED PLATELET AB (HIT ANTIBODY)

## 2019-11-23 LAB — DIC (DISSEMINATED INTRAVASCULAR COAGULATION)PANEL
D-Dimer, Quant: 3.71 ug/mL-FEU — ABNORMAL HIGH (ref 0.00–0.50)
Fibrinogen: >800 mg/dL — ABNORMAL HIGH (ref 210–475)
INR: 1 (ref 0.8–1.2)
Platelets: 36 10*3/uL — ABNORMAL LOW (ref 150–400)
Prothrombin Time: 13.2 seconds (ref 11.4–15.2)
Smear Review: NONE SEEN
aPTT: 45 seconds — ABNORMAL HIGH (ref 24–36)

## 2019-11-23 LAB — CBC
HCT: 31.8 % — ABNORMAL LOW (ref 39.0–52.0)
Hemoglobin: 10.8 g/dL — ABNORMAL LOW (ref 13.0–17.0)
MCH: 31.6 pg (ref 26.0–34.0)
MCHC: 34 g/dL (ref 30.0–36.0)
MCV: 93 fL (ref 80.0–100.0)
Platelets: 18 10*3/uL — CL (ref 150–400)
RBC: 3.42 MIL/uL — ABNORMAL LOW (ref 4.22–5.81)
RDW: 13.7 % (ref 11.5–15.5)
WBC: 7.8 10*3/uL (ref 4.0–10.5)
nRBC: 0.3 % — ABNORMAL HIGH (ref 0.0–0.2)

## 2019-11-23 LAB — ABO/RH: ABO/RH(D): B POS

## 2019-11-23 LAB — RESPIRATORY PANEL BY RT PCR (FLU A&B, COVID)
Influenza A by PCR: NEGATIVE
Influenza B by PCR: NEGATIVE
SARS Coronavirus 2 by RT PCR: NEGATIVE

## 2019-11-23 LAB — MAGNESIUM: Magnesium: 2.6 mg/dL — ABNORMAL HIGH (ref 1.7–2.4)

## 2019-11-23 LAB — CK: Total CK: 4508 U/L — ABNORMAL HIGH (ref 49–397)

## 2019-11-23 MED ORDER — DEXTROSE 10 % IV SOLN: INTRAVENOUS | Status: DC

## 2019-11-23 MED ORDER — PANTOPRAZOLE SODIUM 40 MG IV SOLR: 40.0000 mg | Freq: Two times a day (BID) | INTRAVENOUS | Status: DC

## 2019-11-23 MED ORDER — PANTOPRAZOLE SODIUM 40 MG IV SOLR
40.0000 mg | Freq: Once | INTRAVENOUS | Status: AC
  Administered 2019-11-23: 40 mg via INTRAVENOUS
  Filled 2019-11-23: qty 40

## 2019-11-23 MED ORDER — NOREPINEPHRINE 4 MG/250ML-% IV SOLN: 2.0000 ug/min | INTRAVENOUS | Status: DC

## 2019-11-23 MED ORDER — DEXTROSE 50 % IV SOLN
INTRAVENOUS | Status: AC
  Administered 2019-11-23: 25 mL
  Filled 2019-11-23: qty 50

## 2019-11-23 MED ORDER — DEXTROSE 5 % IV SOLN: INTRAVENOUS | Status: DC

## 2019-11-23 MED ORDER — TRANEXAMIC ACID-NACL 1000-0.7 MG/100ML-% IV SOLN
1000.0000 mg | Freq: Once | INTRAVENOUS | Status: AC
  Administered 2019-11-23: 1000 mg via INTRAVENOUS
  Filled 2019-11-23 (×2): qty 100

## 2019-11-23 MED ORDER — SODIUM CHLORIDE 0.9 % IV SOLN
80.0000 mg | Freq: Once | INTRAVENOUS | Status: AC
  Administered 2019-11-23: 80 mg via INTRAVENOUS
  Filled 2019-11-23 (×2): qty 80

## 2019-11-23 MED ORDER — SODIUM CHLORIDE 0.9 % IV SOLN
8.0000 mg/h | INTRAVENOUS | Status: DC
  Administered 2019-11-23 – 2019-11-24 (×4): 8 mg/h via INTRAVENOUS
  Filled 2019-11-23 (×4): qty 80

## 2019-11-23 MED ORDER — SODIUM CHLORIDE 0.9% IV SOLUTION: Freq: Once | INTRAVENOUS | Status: AC

## 2019-11-23 MED ORDER — CALCIUM GLUCONATE-NACL 2-0.675 GM/100ML-% IV SOLN: 2.0000 g | Freq: Once | INTRAVENOUS | Status: DC

## 2019-11-23 MED ORDER — VASOPRESSIN 20 UNIT/ML IV SOLN
0.0300 [IU]/min | INTRAVENOUS | Status: DC
  Administered 2019-11-23: 0.03 [IU]/min via INTRAVENOUS
  Filled 2019-11-23 (×2): qty 2

## 2019-11-23 MED ORDER — NOREPINEPHRINE 16 MG/250ML-% IV SOLN: 2.0000 ug/min | INTRAVENOUS | Status: DC

## 2019-11-23 MED ORDER — NOREPINEPHRINE 4 MG/250ML-% IV SOLN
INTRAVENOUS | Status: AC
  Filled 2019-11-23: qty 250

## 2019-11-23 MED ORDER — CALCIUM GLUCONATE-NACL 1-0.675 GM/50ML-% IV SOLN
1.0000 g | Freq: Once | INTRAVENOUS | Status: AC
  Administered 2019-11-23: 1000 mg via INTRAVENOUS
  Filled 2019-11-23: qty 50

## 2019-11-23 MED ORDER — SODIUM CHLORIDE 0.9 % IV SOLN
250.0000 mL | INTRAVENOUS | Status: DC
  Administered 2019-11-23: 250 mL via INTRAVENOUS

## 2019-11-23 MED ORDER — NOREPINEPHRINE 4 MG/250ML-% IV SOLN
0.0000 ug/min | INTRAVENOUS | Status: DC
  Administered 2019-11-23: 6 ug/min via INTRAVENOUS

## 2019-11-23 MED ORDER — NOREPINEPHRINE 16 MG/250ML-% IV SOLN
0.0000 ug/min | INTRAVENOUS | Status: DC
  Administered 2019-11-23: 35 ug/min via INTRAVENOUS
  Administered 2019-11-23: 5 ug/min via INTRAVENOUS
  Administered 2019-11-24: 21.333 ug/min via INTRAVENOUS
  Filled 2019-11-23 (×4): qty 250

## 2019-11-23 MED ORDER — SODIUM PHOSPHATES 45 MMOLE/15ML IV SOLN
30.0000 mmol | Freq: Once | INTRAVENOUS | Status: AC
  Administered 2019-11-23: 30 mmol via INTRAVENOUS
  Filled 2019-11-23: qty 10

## 2019-11-23 NOTE — Procedures (Signed)
Arterial Catheter Insertion Procedure Note Brennen Camper Lora [id number] Dec 04, 1970  Procedure: Insertion of Arterial Catheter  Indications: Blood pressure monitoring and Frequent blood sampling  Procedure Details Consent: Risks of procedure as well as the alternatives and risks of each were explained to the (patient/caregiver).  Consent for procedure obtained. Time Out: Verified patient identification, verified procedure, site/side was marked, verified correct patient position, special equipment/implants available, medications/allergies/relevent history reviewed, required imaging and test results available.  Performed  Maximum sterile technique was used including antiseptics, cap, gloves, gown, hand hygiene, mask and sheet. Skin prep: Chlorhexidine; local anesthetic administered 20 gauge catheter was inserted into left radial artery using the Seldinger technique. ULTRASOUND GUIDANCE USED: YES Evaluation Blood flow good; BP tracing good. Complications: No apparent complications.   Hans Rusher Celine Mans 11/23/2019

## 2019-11-23 NOTE — Progress Notes (Addendum)
Patient suddenly vomiting dark brown/red emesis via mouth. Hooked patient to OG suction-low intermittent and obtained 2.5 L of sudden dark red (maroon-colored) output between 1500-1530. Notified Dr. Denese Killings, who came to bedside. Pt on max Levophed and showing low BPs with MAPs in 40s-50s. Orders for massive transfusion protocol - PRBC, FFP and Platelets. Pt also received bolus of NS. Orders for lab draws. CRRT continuing, not removing fluid at the moment per Dr. Denese Killings. Holding scheduled MRI for now.   1700h Update: NG tube output has subsided. Pt has A line, blood pressure improved to SBP 100s (MAPs 80s) and titrating Levophed down. GI consulted and at bedside. Will continue to monitor.  Joycie Peek, RN 11/23/2019 1700h

## 2019-11-23 NOTE — Progress Notes (Signed)
Assisted tele visit to patient with wife.  Johnney Scarlata M, RN  

## 2019-11-23 NOTE — Consult Note (Signed)
Referring Provider:  [facility]         Primary Care Physician:  System, Pcp Not In Primary Gastroenterologist: Althia Forts            We were asked to see this patient for:      Upper GI bleed            ASSESSMENT /  PLAN    # Out of hospital PEA arrest  # Acute hypoxemic respiratory failure secondary to cardiac arrest --On ventilator  # UGI bleed with hemodynamic instability --Oozing from abdominal wall / IV site. DIC workup in progress --Takes NSAIDS at home per recent PCP note in [place]. PUD? NGT trauma? No significant Etoh use / concern for liver disease at this point --This am patient vomited while in process of being turned in bed. --At 3pm he began having brisk upper GI bleeding with 2 Liters of blood from NGT. Initially bright red then darker red --Bleeding in setting of profound thrombocytopenia / coagulopathy.  --Getting platelet transfusion  and FFP now. Will make sure follow up INR is done --Hopefully improvement thrombocytopenia and coagulopathy will halt or slow bleeding. Too risky to proceed with EGD until his counts improve.  --I spoke with mother at bedside. Will go ahead and get consent for EGD --continue PPI infusion --Monitor H+H, transfuse as needed. --Keep enteral feedings off for now  # Profound thrombocytopenia.  --Platelets 160 on admit, now 18.  --Getting platelet transfusion x2 now  #  [place] anemia  --Baseline hgb not known. Hgb 13.7 on admit >>> 10.8 today prior to onset of upper GI bleed --Getting 2U prbc now.   # Elevated liver enzymes --Improving. Initially AST / ALT in ~ 2000. Now 851 / 746 respectively. Liver enzymes normal late April --? Ischemic hepatitis following arrest.  --Abd U/S 11/19/19 - liver unremarkable.  Small amount of perihepatic and pericholecystic ascites  # AKI --on CRRT  --Likely secondary to shock  / rhabdomyolysis.   # Coagulopathy --INR 2 on 11/22/2019 --Getting FFP now  # Protein malnutrition  #  Polysubstance abuse   HPI:    Chief Complaint: GI bleed  Lawrence Dennis is a 49 y.o. male [place]17[place]  According to the admission note EMS called as they were loading him into the truck patient lost his pulse.  He had 6 minutes of CPR before ROSC.  Urine tox screen positive for cocaine/THC/amphetamines  Patient unable to provide history so it comes from the chart and mother in room. Patient has no chronic GI problems. I reviewed PCP n ote   PMH: No significant PMH per mother. Per PCP notes in [facility] he has anxiety  SOC: Hx of polysubstance abuse  PSH:  I +D abdominal wall abscess in 2020  Prior to Admission medications   Medication Sig Start Date End Date Taking? Authorizing Provider  busPIRone (BUSPAR) 10 MG tablet Take 10 mg by mouth 2 (two) times daily. 10/29/19   [provider]  mirtazapine (REMERON) 30 MG tablet Take 30 mg by mouth at bedtime. 10/31/19   [provider]  PARoxetine (PAXIL) 40 MG tablet Take 40 mg by mouth daily. 10/29/19   [provider]    Current Facility-Administered Medications  Medication Dose Route Frequency Provider Last Rate Last Admin  . 0.9 %  sodium chloride infusion (Manually program via Guardrails IV Fluids)   Intravenous Once Kipp Brood, MD      .  0.9 %  sodium chloride infusion   Intravenous Continuous Merlene Laughter F, NP 10 mL/hr at 11/23/19 1200 Rate Verify at 11/23/19 1200  . 0.9 %  sodium chloride infusion  250 mL Intravenous Continuous Kipp Brood, MD   Stopped at 11/23/19 1314  . B-complex with vitamin C tablet 1 tablet  1 tablet Oral Daily Kipp Brood, MD   1 tablet at 11/23/19 1009  . chlorhexidine gluconate (MEDLINE KIT) (PERIDEX) 0.12 % solution 15 mL  15 mL Mouth Rinse BID Candee Furbish, MD   15 mL at 11/23/19 0758  . Chlorhexidine Gluconate Cloth 2 % PADS 6 each  6 each Topical Daily Candee Furbish, MD   6 each at 11/23/19 0945  .  dextrose 5 % solution   Intravenous Continuous Anders Simmonds, MD 40 mL/hr at 11/23/19 1400 Rate Verify at 11/23/19 1400  . feeding supplement (VITAL AF 1.2 CAL) liquid 1,000 mL  1,000 mL Per Tube Continuous Kipp Brood, MD   Stopped at 11/23/19 0500  . folic acid (FOLVITE) tablet 1 mg  1 mg Oral Daily Candee Furbish, MD   1 mg at 11/23/19 1009  . heparin injection 1,000-6,000 Units  1,000-6,000 Units CRRT PRN Donato Heinz, MD      . heparinized saline (2000 units/L) primer fluid for CRRT   CRRT PRN Donato Heinz, MD      . insulin aspart (novoLOG) injection 0-6 Units  0-6 Units Subcutaneous Q4H Anders Simmonds, MD   1 Units at 11/21/19 2359  . ipratropium-albuterol (DUONEB) 0.5-2.5 (3) MG/3ML nebulizer solution 3 mL  3 mL Nebulization Q6H PRN Agarwala, Einar Grad, MD      . MEDLINE mouth rinse  15 mL Mouth Rinse 10 times per day Candee Furbish, MD   15 mL at 11/23/19 1353  . norepinephrine (LEVOPHED) 16 mg in 227m premix infusion  0-40 mcg/min Intravenous Titrated Agarwala, Ravi, MD 18.75 mL/hr at 11/23/19 1400 20 mcg/min at 11/23/19 1400  . pantoprazole (PROTONIX) 80 mg in sodium chloride 0.9 % 100 mL (0.8 mg/mL) infusion  8 mg/hr Intravenous Continuous AKipp Brood MD 10 mL/hr at 11/23/19 1546 8 mg/hr at 11/23/19 1546  . pantoprazole (PROTONIX) 80 mg in sodium chloride 0.9 % 100 mL IVPB  80 mg Intravenous Once AKipp Brood MD      . [Derrill MemoON 11/27/2019] pantoprazole (PROTONIX) injection 40 mg  40 mg Intravenous Q12H Agarwala, Ravi, MD      . pantoprazole sodium (PROTONIX) 40 mg/20 mL oral suspension 40 mg  40 mg Per Tube Daily Agarwala, REinar Grad MD   40 mg at 11/23/19 1009  . prismasol BGK 2/2.5 dialysis solution   CRRT Continuous CDonato Heinz MD 1,500 mL/hr at 11/23/19 1514 New Bag at 11/23/19 1514  . prismasol BGK 2/2.5 replacement solution   CRRT Continuous CDonato Heinz MD 500 mL/hr at 11/23/19 0437 New Bag at 11/23/19 0437  . prismasol BGK 2/2.5 replacement  solution   CRRT Continuous CDonato Heinz MD 300 mL/hr at 11/23/19 1419 New Bag at 11/23/19 1419  . sodium chloride flush (NS) 0.9 % injection 10-40 mL  10-40 mL Intracatheter Q12H SCandee Furbish MD   10 mL at 11/22/19 2300  . sodium chloride flush (NS) 0.9 % injection 10-40 mL  10-40 mL Intracatheter PRN SCandee Furbish MD      . sodium phosphate 30 mmol in dextrose 5 % 250 mL infusion  30 mmol Intravenous Once CDonato Heinz MD 43 mL/hr at 11/23/19 1400  Rate Verify at 11/23/19 1400  . thiamine tablet 100 mg  100 mg Oral Daily Candee Furbish, MD   100 mg at 11/23/19 1009  . tranexamic acid (CYKLOKAPRON) IVPB 1,000 mg  1,000 mg Intravenous Once Kipp Brood, MD        Allergies as of 12/10/2019  . (Not on File)    No family history on file.  Social History   Socioeconomic History  . Marital status: Legally Separated    Spouse name: Not on file  . Number of children: Not on file  . Years of education: Not on file  . Highest education level: Not on file  Occupational History  . Not on file  Tobacco Use  . Smoking status: Not on file  Substance and Sexual Activity  . Alcohol use: Not on file  . Drug use: Not on file  . Sexual activity: Not on file  Other Topics Concern  . Not on file  Social History Narrative  . Not on file   Social Determinants of Health   Financial Resource Strain:   . Difficulty of Paying Living Expenses:   Food Insecurity:   . Worried About Charity fundraiser in the Last Year:   . Arboriculturist in the Last Year:   Transportation Needs:   . Film/video editor (Medical):   Marland Kitchen Lack of Transportation (Non-Medical):   Physical Activity:   . Days of Exercise per Week:   . Minutes of Exercise per Session:   Stress:   . Feeling of Stress :   Social Connections:   . Frequency of Communication with Friends and Family:   . Frequency of Social Gatherings with Friends and Family:   . Attends Religious Services:   . Active Member of  Clubs or Organizations:   . Attends Archivist Meetings:   Marland Kitchen Marital Status:   Intimate Partner Violence:   . Fear of Current or Ex-Partner:   . Emotionally Abused:   Marland Kitchen Physically Abused:   . Sexually Abused:     Review of Systems: All systems reviewed and negative except where noted in HPI.  Physical Exam: Vital signs in last 24 hours: Temp:  [96.5 F (35.8 C)-98.2 F (36.8 C)] 97.6 F (36.4 C) (05/07 1515) Pulse Rate:  [91-103] 91 (05/07 1521) Resp:  [10-36] 29 (05/07 1530) BP: (67-126)/(38-100) 77/44 (05/07 1530) SpO2:  [89 %-100 %] 96 % (05/07 1530) FiO2 (%):  [40 %-100 %] 60 % (05/07 1521) Weight:  [70 kg] 70 kg (05/07 0500) Last BM Date: 11/22/19 General:   Thin male sedated on ventilator. Approx 100 cc dark red blood in OGT wall canister Psych: Unable to assess Nose:  No deformity, discharge,  or lesions. Neck:  Supple; no masses Lungs:  On ventilator.Chest without wheezes / crackles Heart:  Regular rate and rhythm no lower extremity edema Abdomen:  Soft, mildly distended, a few BS. No obvious masses   Rectal:  Deferred  Neurologic: Sedated   Intake/Output from previous day: 05/06 0701 - 05/07 0700 In: 1375.9 [I.V.:310.6; NG/GT:1065.3] Out: 3545  Intake/Output this shift: Total I/O In: 1113.6 [I.V.:795.6; NG/GT:155; IV Piggyback:163] Out: 713 [Other:713]  Lab Results: Recent Labs    11/21/19 0438 11/21/19 0438 11/22/19 0400 11/22/19 0415 11/23/19 0400  WBC 14.9*  --  7.9  --  7.8  HGB 12.8*   < > 10.7* 10.5* 10.8*  HCT 36.4*   < > 30.8* 31.0* 31.8*  PLT 60*  --  30*  --  18*   < > = values in this interval not displayed.   BMET Recent Labs    11/22/19 1030 11/22/19 1600 11/23/19 0400  NA 137 138 138  K 5.4* 5.3* 4.6  CL 103 100 100  CO2 26 27 27   GLUCOSE 118* 106* 92  BUN 48* 46* 40*  CREATININE 2.55* 2.24* 1.92*  CALCIUM 6.8* 7.1* 7.6*   LFT Recent Labs    11/23/19 0400  PROT 5.1*  ALBUMIN 1.8*  1.8*  AST 851*  ALT  746*  ALKPHOS 154*  BILITOT 1.6*  BILIDIR 0.7*  IBILI 0.9   PT/INR No results for input(s): LABPROT, INR in the last 72 hours. Hepatitis Panel No results for input(s): HEPBSAG, HCVAB, HEPAIGM, HEPBIGM in the last 72 hours.   . CBC Latest Ref Rng & Units 11/23/2019 11/22/2019 11/22/2019  WBC 4.0 - 10.5 K/uL 7.8 - 7.9  Hemoglobin 13.0 - 17.0 g/dL 10.8(L) 10.5(L) 10.7(L)  Hematocrit 39.0 - 52.0 % 31.8(L) 31.0(L) 30.8(L)  Platelets 150 - 400 K/uL 18(LL) - 30(L)    . CMP Latest Ref Rng & Units 11/23/2019 11/22/2019 11/22/2019  Glucose 70 - 99 mg/dL 92 106(H) 118(H)  BUN 6 - 20 mg/dL 40(H) 46(H) 48(H)  Creatinine 0.61 - 1.24 mg/dL 1.92(H) 2.24(H) 2.55(H)  Sodium 135 - 145 mmol/L 138 138 137  Potassium 3.5 - 5.1 mmol/L 4.6 5.3(H) 5.4(H)  Chloride 98 - 111 mmol/L 100 100 103  CO2 22 - 32 mmol/L 27 27 26   Calcium 8.9 - 10.3 mg/dL 7.6(L) 7.1(L) 6.8(L)  Total Protein 6.5 - 8.1 g/dL 5.1(L) - -  Total Bilirubin 0.3 - 1.2 mg/dL 1.6(H) - -  Alkaline Phos 38 - 126 U/L 154(H) - -  AST 15 - 41 U/L 851(H) - -  ALT 0 - 44 U/L 746(H) - -   Studies/Results: DG Chest Port 1 View  Result Date: 11/23/2019 CLINICAL DATA:  Aspiration into airway. EXAM: PORTABLE CHEST 1 VIEW COMPARISON:  One-view chest x-ray 11/21/2019 FINDINGS: The heart size is normal. Left IJ dialysis catheter is stable. Right IJ line is stable. The endotracheal tube is stable. No focal airspace disease is present. No edema or effusion is present. No pleural effusion or pneumothorax is present. IMPRESSION: 1. Stable support apparatus. 2. No acute cardiopulmonary disease. Electronically Signed   By: San Morelle M.D.   On: 11/23/2019 04:51    Active Problems:   Cardiac arrest [facility])   Protein-calorie malnutrition, severe    Tye Savoy, NP-C @  11/23/2019, 3:51 PM

## 2019-11-23 NOTE — Progress Notes (Signed)
Massive Transfusion Protocol  Red Cells Unit #[id number] Started 11/23/19 1525 Ended 11/23/19 1553 infused FFP Unit #[id number] Started 11/23/19 1538 Ended 11/23/19 1606 infused FFP Unit #[id number] Started 11/23/19 1538 Ended 5/7/211553 infused Platelets Unit #[id number] Started 11/23/19 1533 Ended 11/23/19 1615 infused Red Cells Unit #[id number] Started 11/23/19 1554 Ended 11/23/19 1628 infused.

## 2019-11-23 NOTE — Progress Notes (Signed)
eLink Physician-Brief Progress Note Patient Name: Lawrence Dennis DOB: 11-01-1970 MRN: [id number]   Date of Service  11/23/2019  HPI/Events of Note  Hypoglycemia - Blood glucose 87. Tube feeds on hold d/t vomiting.   eICU Interventions  Will order: 1. D5W IV infusion to run at 40 mL/hour.      Intervention Category Major Interventions: Other:  [name] 11/23/2019, 5:19 AM

## 2019-11-23 NOTE — Progress Notes (Signed)
CPT thru the bed held at this time. Pt with active GI bleed and to be transfused.

## 2019-11-23 NOTE — Progress Notes (Signed)
eLink Physician-Brief Progress Note Patient Name: Lawrence Dennis DOB: December 23, 1970 MRN: [id number]   Date of Service  11/23/2019  HPI/Events of Note  Patient vomited while HOB down to turn patient. Emesis noted in and around ETT.   eICU Interventions  Will order: 1. Portable CXR STAT.     Intervention Category Major Interventions: Other:  [name] 11/23/2019, 4:31 AM

## 2019-11-23 NOTE — Progress Notes (Signed)
Patient ID: Rayner Erman Schlotter, male   DOB: 07-22-1970, 49 y.o.   MRN: [id number] S: Weaned off of pressors overnight but remains hypotensive and tachycardic this am.  Also had an episode of vomiting early this am.  Tube feeds stopped. O:BP (!) 81/68   Pulse (!) 103   Temp (!) 96.5 F (35.8 C) (Axillary)   Resp (!) 29   Ht 6' (1.829 m)   Wt 70 kg   SpO2 94%   BMI 20.93 kg/m   Intake/Output Summary (Last 24 hours) at 11/23/2019 0852 Last data filed at 11/23/2019 0800 Gross per 24 hour  Intake 1337.6 ml  Output 3608 ml  Net -2270.4 ml   Intake/Output: I/O last 3 completed shifts: In: 3128.6 [I.V.:1333.2; NG/GT:1545.3; IV Piggyback:250.1] Out: 9767 [Other:5042]  Intake/Output this shift:  Total I/O In: 50 [I.V.:50] Out: 139 [Other:139] Weight change:  Gen: Intubated and sedated CVS: tachycardic at 103 Resp: scattered rhonchi Abd: +BS, soft, Nt/nd Ext: 1+ edema of lower ext. Mottled/bruised appearing legs and toes bilaterally  Recent Labs  Lab 12/12/2019 1007 11/25/2019 1044 11/19/19 0505 11/19/19 0532 11/20/19 0444 11/20/19 1529 11/21/19 0438 11/21/19 0438 11/21/19 1300 11/21/19 1300 11/21/19 1555 11/21/19 1800 11/22/19 0400 11/22/19 0415 11/22/19 1030 11/22/19 1600 11/23/19 0400  NA 146*   < > 141   < > 141   < > 142   < > 141   < > 141 141 138 139 137 138 138  K 5.1   < > 3.1*   < > 3.8   < > 5.9*   < > 6.4*   < > 5.9* 6.0* 5.2* 5.2* 5.4* 5.3* 4.6  CL 109   < > 101   < > 100   < > 101   < > 102  --  105 103 103  --  103 100 100  CO2 <7*   < > 20*   < > 27  --  24   < > 24  --  24 24 25   --  26 27 27   GLUCOSE 66*   < > 229*   < > 61*   < > 135*   < > 142*  --  133* 145* 152*  --  118* 106* 92  BUN 31*   < > 40*   < > 47*   < > 70*   < > 72*  --  64* 58* 50*  --  48* 46* 40*  CREATININE 3.67*   < > 3.00*   < > 3.46*   < > 4.73*   < > 4.43*  --  3.99* 3.72* 2.93*  --  2.55* 2.24* 1.92*  ALBUMIN 3.2*   < > 2.4*   < > 2.0*  --  1.8*  --   --   --  1.7*  --  1.5*  --   1.7* 1.8* 1.8*  1.8*  CALCIUM 8.9   < > 6.7*   < > 6.6*  --  6.3*   < > 6.4*  --  6.5* 6.6* 6.7*  --  6.8* 7.1* 7.6*  PHOS  --   --  7.6*  --   --   --  4.8*  --   --   --  5.1*  --   --   --  4.8* 3.9 1.9*  AST 3,759*  --  3,419*  --  2,022*  --  1,367*  --   --   --   --   --  960*  --   --   --  851*  ALT 2,472*  --  2,635*  --  1,752*  --  1,229*  --   --   --   --   --  906*  --   --   --  746*   < > = values in this interval not displayed.   Liver Function Tests: Recent Labs  Lab 11/21/19 0438 11/21/19 1555 11/22/19 0400 11/22/19 0400 11/22/19 1030 11/22/19 1600 11/23/19 0400  AST 1,367*  --  960*  --   --   --  851*  ALT 1,229*  --  906*  --   --   --  746*  ALKPHOS 92  --  101  --   --   --  154*  BILITOT 1.7*  --  1.2  --   --   --  1.6*  PROT 4.2*  --  4.4*  --   --   --  5.1*  ALBUMIN 1.8*   < > 1.5*   < > 1.7* 1.8* 1.8*  1.8*   < > = values in this interval not displayed.   No results for input(s): LIPASE, AMYLASE in the last 168 hours. No results for input(s): AMMONIA in the last 168 hours. CBC: Recent Labs  Lab 11/19/19 0657 11/19/19 2342 11/20/19 0444 11/20/19 1529 11/21/19 0438 11/21/19 0438 11/22/19 0400 11/22/19 0415 11/23/19 0400  WBC 2.0*  --  7.4  --  14.9*  --  7.9  --  7.8  HGB 13.6   < > 13.8   < > 12.8*   < > 10.7* 10.5* 10.8*  HCT 37.1*   < > 37.6*   < > 36.4*   < > 30.8* 31.0* 31.8*  MCV 89.2  --  88.1  --  89.9  --  92.2  --  93.0  PLT 52*  --  76*  --  60*  --  30*  --  18*   < > = values in this interval not displayed.   Cardiac Enzymes: Recent Labs  Lab 12/01/2019 1530 11/19/19 0505 11/20/19 0952  CKTOTAL 9,230* 18,036* 18,651*   CBG: Recent Labs  Lab 11/22/19 2005 11/22/19 2006 11/22/19 2359 11/23/19 0402 11/23/19 0753  GLUCAP 82 80 83 87 81    Iron Studies: No results for input(s): IRON, TIBC, TRANSFERRIN, FERRITIN in the last 72 hours. Studies/Results: DG Chest Port 1 View  Result Date: 11/23/2019 CLINICAL DATA:   Aspiration into airway. EXAM: PORTABLE CHEST 1 VIEW COMPARISON:  One-view chest x-ray 11/21/2019 FINDINGS: The heart size is normal. Left IJ dialysis catheter is stable. Right IJ line is stable. The endotracheal tube is stable. No focal airspace disease is present. No edema or effusion is present. No pleural effusion or pneumothorax is present. IMPRESSION: 1. Stable support apparatus. 2. No acute cardiopulmonary disease. Electronically Signed   By: San Morelle M.D.   On: 11/23/2019 04:51   . B-complex with vitamin C  1 tablet Oral Daily  . chlorhexidine gluconate (MEDLINE KIT)  15 mL Mouth Rinse BID  . Chlorhexidine Gluconate Cloth  6 each Topical Daily  . folic acid  1 mg Oral Daily  . insulin aspart  0-6 Units Subcutaneous Q4H  . mouth rinse  15 mL Mouth Rinse 10 times per day  . pantoprazole sodium  40 mg Per Tube Daily  . sodium chloride flush  10-40 mL Intracatheter Q12H  . thiamine  100 mg Oral Daily  BMET    Component Value Date/Time   NA 138 11/23/2019 0400   K 4.6 11/23/2019 0400   CL 100 11/23/2019 0400   CO2 27 11/23/2019 0400   GLUCOSE 92 11/23/2019 0400   BUN 40 (H) 11/23/2019 0400   CREATININE 1.92 (H) 11/23/2019 0400   CALCIUM 7.6 (L) 11/23/2019 0400   GFRNONAA 40 (L) 11/23/2019 0400   GFRAA 46 (L) 11/23/2019 0400   CBC    Component Value Date/Time   WBC 7.8 11/23/2019 0400   RBC 3.42 (L) 11/23/2019 0400   HGB 10.8 (L) 11/23/2019 0400   HCT 31.8 (L) 11/23/2019 0400   PLT 18 (LL) 11/23/2019 0400   MCV 93.0 11/23/2019 0400   MCH 31.6 11/23/2019 0400   MCHC 34.0 11/23/2019 0400   RDW 13.7 11/23/2019 0400   Assessment/Plan: 1. AKI: Likely 2/2 hypoperfusion in the setting of shockas well as rhabdomyolysis.Baseline creatinine appears to be 1.Cr trend continues to rise, patient initially acidotic but has improved. UOP has been decreasing, Initially on 5/2, was 730, 5/3 was 1232, 5/4 was 60. Patient has been receiving D5NS at 75 cc/hr and currently  also on levophed for BP support. Cr 3.67 on admission, decreased to 2.97, but has since progressively increased, now at 4.73. BUN also progressively increasing fom 31>70. CK elevated to 18,651, rhabdo also likely contributing.Appears euvolemic on examination. 1. Started CRRT 11/21/19 2. HD catheter placed by CCM 11/21/19 and appears to be functioning well. 3. Will decrease UF goal to 0-50 ml/hr due to hypotension and tachycardia 2. AMS: Initial EEG with encephalopathic changes. Now with improvement on LTM. Overall mental status also improving per notes, although patient is not following commands 3. Acute hypoxemic respiratory failure secondary to cardiac arrest: Currently intubated on mechanical ventilation. Per CCM. 4. Suspected aspiration pneumonia: Currently on cefepime(5/2- ). S/p vancomycin 5/2-5/3. Management per primary. 5. Shock: Per CCM. Currently off levophed. AST/ALT also elevatedwith increase in troponin. Likely main source of problem #1. 6. Rhabdomyolysis- will follow serial CK levels with CRRT 7. Hyperkalemia: K 5.9, s/p lokelma and calcium gluconate.  Still with elevated potassium but improving with CRRT.  1. Using 2K bath for all fluids, finally improved to 4.6 8. Protein malnutrition- severe albumin 1.i 9. Hypophosphatemia- will replete IV and follow 10. Normocytic anemia: Hgb 13.7 on admission, now 10.5. Likely dilutional effect with fluids. Continue to monitor.  11. Thrombocytopenia: Plt 30, down from 160 on admission. 12. Drug Abuse: UDS positive forcocaine, THC, amphetamines.Will need counseling  Donetta Potts, MD [facility] [phone]

## 2019-11-23 NOTE — Progress Notes (Signed)
NAME:  Karl Erway Hoxworth, MRN:  [id number], DOB:  1971-01-08, LOS: 5 ADMISSION DATE:  11/19/19, CONSULTATION DATE:  Nov 19, 2019 REFERRING MD:  Dr. Rosalia Hammers, CHIEF COMPLAINT:  Arrest   Brief History   49 year old man presenting with PEA arrest. Initially a [name].  History of present illness   Unknown age male [place] around [place].  EMS called and as they were loading him into truck he lost pulse.  ~6 mins CPR before ROSC.  Poor mental status after ROSC.  CT head and CXR benign.  Initial lactate >11 and initial pH 6.5.  Utox cocaine/THC/amphetamines.  Past Medical History  Major depression and longstanding drug use.   Significant Hospital Events   5/2 admitted  Consults:  PCCM  Procedures:  5/2: ETT>> R rad A line 5/2>>> R IJ CVL 5/2>>>  Significant Diagnostic Tests:  CT Head: no acute injury CXR: hyperinflation otherwise normal 2D echo 5/3>> EF 50-55%, mod AS abd u/s 5/3>>> Small amount of perihepatic and pericholecystic ascites. Increased echotexture in the kidneys suggesting chronic medical renal disease.  Micro Data:  COVID>>NEG  Antimicrobials:  5/2 Vanc >> 5/2 Cefepime >>   Interim history/subjective:  Less responsive today.  Progressive then more abrupt drop in blood pressure requiring initiation of vasopressors.  Frankly bloody NG aspirate noted.  Objective   Blood pressure (!) 56/42, pulse 91, temperature (!) 95.1 F (35.1 C), temperature source Axillary, resp. rate (!) 24, height 6' (1.829 m), weight 70 kg, SpO2 96 %. CVP:  [2 mmHg-18 mmHg] 14 mmHg  Vent Mode: PCV FiO2 (%):  [40 %-100 %] 60 % Set Rate:  [25 bmp] 25 bmp PEEP:  [5 cmH20] 5 cmH20 Pressure Support:  [5 cmH20] 5 cmH20 Plateau Pressure:  [7 cmH20-33 cmH20] 33 cmH20   Intake/Output Summary (Last 24 hours) at 11/23/2019 1644 Last data filed at 11/23/2019 1400 Gross per 24 hour  Intake 2023.95 ml  Output 2903 ml  Net -879.05 ml   Filed Weights   11/21/19 0000 11/21/19 0500  11/23/19 0500  Weight: 70.7 kg 70.7 kg 70 kg    Examination: General: male pt, NAD, off all sedation.  HENT: ETT/OGT in place, pupils 57mm sluggish, mm moist, no JVD  Lungs: Normal breath sounds, t Cardiovascular: RRR, HS normal.  Extremities no cool.  Poor capillary refill.  Abdomen: incisional scars over RUQ/LUQ, soft, hypoactive BS.  Greater than 500 mL of frankly bloody NG aspirate. Extremities: no sig edema Neuro: off sedation, no opening eyes weakly to voice. Not following commands in limbs. Fine LE tremor consistent with shivering.   Resolved Hospital Problem list   N/A  Assessment & Plan:    Critically ill due to hemorrhagic shock due to upper GI bleed of unclear source requiring massive transfusion protocol and initiation of vasopressors.  Hemodynamic stabilization on Levophed and following transfusion. -Seen by GI who will defer endoscopy given thrombocytopenia. -Continue resuscitation. -Protonix infusion.  Critically ill due to witnessed out of hospital PEA arrest requiring TTM.  Short down time and immediate CPR - potential for reasonable neurological recovery.  Cause of arrest as yet unclear. -TTM completed. -Observe for neurological recovery.  Acute hypoxemic respiratory failure due to cardiac arrest requiring mechanical ventilation.  PLAN -  -Full ventilatory support until bleeding stops.   Possible aspiration pneumonia as suggested by copious secretions hyperinflation on CXR suggests underlying COPD. PLAN -  Continue empiric abx as above D2/x BD's  F/u CXR   Possible stress cardiomyopathy with type  II myocardial injury - resolved. Echo relatively normal   Acute kidney injury due to hypotension and possible drug ingestions. Associated hyperkalemia and oliguria.  Mild kidney echogenicity. Likely combination of hypotension and rhabdomyolysis. -Initiate CRRT for electrolyte correction.  May also help improve neurological status. - Begin UF  Shock liver with  elevated transaminases.  Continues to improve No evidence of hepatic dysfunction.  Normal ultrasound. -Follow liver profile.  Thrombocytopenia of acute illness.  No bleeding diatheses at present. -HITT panel.. -Heparin stopped -Platelet transfusions for active bleeding.  Daily Goals Checklist  Pain/Anxiety/Delirium protocol (if indicated): keep off sedation.  VAP protocol (if indicated): Bundle in place.  Respiratory support goals: PRVC, Wean FiO2, SBT daily. Blood pressure target: Titrate NE to keep MAP>65 DVT prophylaxis: SCDs only. Nutrition Status: Feeds on hold due to GI bleeding GI prophylaxis: Pantoprazole infusion Fluid status goals: Transfuse to maintain blood pressure and normalize hematocrit.  Follow CBC and coagulation profile. Urinary catheter: Guide hemodynamic management Central lines: Triple-lumen central line, arterial line.  HD line. Glucose control: Currently euglycemic on sliding scale insulin.. Stop stress dose steroids. Mobility/therapy needs: Bedrest. Antibiotic de-escalation: No antibiotics  Home medication reconciliation: Unknown home medications. Daily labs: Daily CBC and CMP Code Status: Full code. Family Communication: Patient identified by fingerprints as  Clent Demark Corralejo, JR. attempting to contact family. Disposition: ICU.   Labs   CBC: Recent Labs  Lab 11/19/19 0657 11/19/19 2342 11/20/19 0444 11/20/19 1529 11/21/19 0431 11/21/19 0438 11/22/19 0400 11/22/19 0415 11/23/19 0400  WBC 2.0*  --  7.4  --   --  14.9* 7.9  --  7.8  HGB 13.6   < > 13.8   < > 11.9* 12.8* 10.7* 10.5* 10.8*  HCT 37.1*   < > 37.6*   < > 35.0* 36.4* 30.8* 31.0* 31.8*  MCV 89.2  --  88.1  --   --  89.9 92.2  --  93.0  PLT 52*  --  76*  --   --  60* 30*  --  18*   < > = values in this interval not displayed.    Basic Metabolic Panel: Recent Labs  Lab 11/19/19 0505 11/19/19 0532 11/21/19 0438 11/21/19 1300 11/21/19 1555 11/21/19 1555 11/21/19 1800  11/21/19 1800 11/22/19 0400 11/22/19 0415 11/22/19 1030 11/22/19 1600 11/23/19 0400  NA 141   < > 142   < > 141   < > 141   < > 138 139 137 138 138  K 3.1*   < > 5.9*   < > 5.9*   < > 6.0*   < > 5.2* 5.2* 5.4* 5.3* 4.6  CL 101   < > 101   < > 105   < > 103  --  103  --  103 100 100  CO2 20*   < > 24   < > 24   < > 24  --  25  --  26 27 27   GLUCOSE 229*   < > 135*   < > 133*   < > 145*  --  152*  --  118* 106* 92  BUN 40*   < > 70*   < > 64*   < > 58*  --  50*  --  48* 46* 40*  CREATININE 3.00*   < > 4.73*   < > 3.99*   < > 3.72*  --  2.93*  --  2.55* 2.24* 1.92*  CALCIUM 6.7*   < > 6.3*   < >  6.5*   < > 6.6*  --  6.7*  --  6.8* 7.1* 7.6*  MG 2.9*  --  1.6*  --   --   --   --   --  2.1  --   --   --  2.6*  PHOS 7.6*   < > 4.8*  --  5.1*  --   --   --   --   --  4.8* 3.9 1.9*   < > = values in this interval not displayed.   GFR: Estimated Creatinine Clearance: 46.1 mL/min (A) (by C-G formula based on SCr of 1.92 mg/dL (H)). Recent Labs  Lab 12/02/2019 1007 12/03/2019 1530 12/16/2019 1939 12/12/2019 2348 11/19/19 0505 11/19/19 0657 11/20/19 0444 11/20/19 1608 11/21/19 0438 11/22/19 0400 11/23/19 0400  PROCALCITON  --  52.51  --   --  70.01  --   --   --   --   --   --   WBC  --   --   --   --   --    < > 7.4  --  14.9* 7.9 7.8  LATICACIDVEN   < >  --  7.5* 7.7* 9.0*  --   --  4.8*  --   --   --    < > = values in this interval not displayed.    Liver Function Tests: Recent Labs  Lab 11/19/19 0505 11/19/19 0505 11/20/19 0444 11/20/19 0444 11/21/19 0438 11/21/19 0438 11/21/19 1555 11/22/19 0400 11/22/19 1030 11/22/19 1600 11/23/19 0400  AST 4,857*  --  2,022*  --  1,367*  --   --  960*  --   --  851*  ALT 2,635*  --  1,752*  --  1,229*  --   --  906*  --   --  746*  ALKPHOS 61  --  73  --  92  --   --  101  --   --  154*  BILITOT 1.3*  --  1.9*  --  1.7*  --   --  1.2  --   --  1.6*  PROT 4.1*  --  3.9*  --  4.2*  --   --  4.4*  --   --  5.1*  ALBUMIN 2.4*   < > 2.0*   <  > 1.8*   < > 1.7* 1.5* 1.7* 1.8* 1.8*  1.8*   < > = values in this interval not displayed.   No results for input(s): LIPASE, AMYLASE in the last 168 hours. No results for input(s): AMMONIA in the last 168 hours.  ABG    Component Value Date/Time   PHART 7.421 11/22/2019 0415   PCO2ART 40.8 11/22/2019 0415   PO2ART 78 (L) 11/22/2019 0415   HCO3 26.5 11/22/2019 0415   TCO2 28 11/22/2019 0415   ACIDBASEDEF 3.0 (H) 11/19/2019 0532   O2SAT 96.0 11/22/2019 0415     Coagulation Profile: Recent Labs  Lab 11/17/2019 1007 12/11/2019 1747 11/17/2019 1939  INR 2.9* 1.9* 2.0*    Cardiac Enzymes: Recent Labs  Lab 11/26/2019 1530 11/19/19 0505 11/20/19 0952 11/23/19 0918  CKTOTAL 9,230* 18,036* 18,651* 4,508*    HbA1C: Hgb A1c MFr Bld  Date/Time Value Ref Range Status  11/20/2019 03:30 PM 5.3 4.8 - 5.6 % Final    Comment:    (NOTE) Pre diabetes:          5.7%-6.4% Diabetes:              >  6.4% Glycemic control for   <7.0% adults with diabetes     CBG: Recent Labs  Lab 11/22/19 2359 11/23/19 0402 11/23/19 0753 11/23/19 1120 11/23/19 1535  GLUCAP 83 87 81 101* 76    CRITICAL CARE Performed by: Lynnell Catalan   Total critical care time:  Critical care time was exclusive of separately billable procedures and treating other patients.  Critical care was necessary to treat or prevent imminent or life-threatening deterioration.  Critical care was time spent personally by me on the following activities: development of treatment plan with patient and/or surrogate as well as nursing, discussions with consultants, evaluation of patient's response to treatment, examination of patient, obtaining history from patient or surrogate, ordering and performing treatments and interventions, ordering and review of laboratory studies, ordering and review of radiographic studies, pulse oximetry, re-evaluation of patient's condition and participation in multidisciplinary  rounds.   Lynnell Catalan, MD [facility] ICU Physician [facility]  Pager: [phone] Mobile: [phone] After hours: [phone].  11/23/2019, 4:44 PM

## 2019-11-23 NOTE — Progress Notes (Addendum)
eLink Physician-Brief Progress Note Patient Name: Parvin Stetzer Biven DOB: 1971-02-09 MRN: [id number]   Date of Service  11/23/2019  HPI/Events of Note  Hypotension on high dose levophed, RN requested vasopressin Also has a MRI order in from this AM, RN requesting this be changed as not stable for MRI at this time  eICU Interventions  Added vaso Hold MRI tonight      Intervention Category Major Interventions: Shock - evaluation and management  Oretha Milch 11/23/2019, 9:17 PM   Addendum Spoke with RRN who noted patient was hypotensive and hypoglycemic when he came in Has had massive transfusion protocol done Will check a CBC and ionized calcium as well INR was fine this evening Also start d10 at 50 cc/hour RN will call with labs   10.30  Pm CBC noted Transfuse platelets since patient had massive GI bleed and planned for EGD in AM Also will give calcium gluconate

## 2019-11-24 ENCOUNTER — Inpatient Hospital Stay (HOSPITAL_COMMUNITY): Payer: Self-pay

## 2019-11-24 DIAGNOSIS — R748 Abnormal levels of other serum enzymes: Secondary | ICD-10-CM

## 2019-11-24 DIAGNOSIS — D696 Thrombocytopenia, unspecified: Secondary | ICD-10-CM

## 2019-11-24 LAB — RENAL FUNCTION PANEL
Albumin: 1.6 g/dL — ABNORMAL LOW (ref 3.5–5.0)
Anion gap: 11 (ref 5–15)
BUN: 33 mg/dL — ABNORMAL HIGH (ref 6–20)
CO2: 24 mmol/L (ref 22–32)
Calcium: 7.1 mg/dL — ABNORMAL LOW (ref 8.9–10.3)
Chloride: 101 mmol/L (ref 98–111)
Creatinine, Ser: 1.59 mg/dL — ABNORMAL HIGH (ref 0.61–1.24)
GFR calc Af Amer: 58 mL/min — ABNORMAL LOW (ref >60)
GFR calc non Af Amer: 50 mL/min — ABNORMAL LOW (ref >60)
Glucose, Bld: 93 mg/dL (ref 70–99)
Phosphorus: 3.1 mg/dL (ref 2.5–4.6)
Potassium: 3.7 mmol/L (ref 3.5–5.1)
Sodium: 136 mmol/L (ref 135–145)

## 2019-11-24 LAB — POCT I-STAT 7, (LYTES, BLD GAS, ICA,H+H)
Acid-Base Excess: 3 mmol/L — ABNORMAL HIGH (ref 0.0–2.0)
Bicarbonate: 26.3 mmol/L (ref 20.0–28.0)
Calcium, Ion: 1.05 mmol/L — ABNORMAL LOW (ref 1.15–1.40)
HCT: 27 % — ABNORMAL LOW (ref 39.0–52.0)
Hemoglobin: 9.2 g/dL — ABNORMAL LOW (ref 13.0–17.0)
O2 Saturation: 99 %
Patient temperature: 98
Potassium: 3.6 mmol/L (ref 3.5–5.1)
Sodium: 134 mmol/L — ABNORMAL LOW (ref 135–145)
TCO2: 27 mmol/L (ref 22–32)
pCO2 arterial: 34.2 mmHg (ref 32.0–48.0)
pH, Arterial: 7.492 — ABNORMAL HIGH (ref 7.350–7.450)
pO2, Arterial: 113 mmHg — ABNORMAL HIGH (ref 83.0–108.0)

## 2019-11-24 LAB — CBC
HCT: 29.6 % — ABNORMAL LOW (ref 39.0–52.0)
HCT: 27.3 % — ABNORMAL LOW (ref 39.0–52.0)
Hemoglobin: 10.5 g/dL — ABNORMAL LOW (ref 13.0–17.0)
Hemoglobin: 9.4 g/dL — ABNORMAL LOW (ref 13.0–17.0)
MCH: 31.8 pg (ref 26.0–34.0)
MCH: 31.1 pg (ref 26.0–34.0)
MCHC: 35.5 g/dL (ref 30.0–36.0)
MCHC: 34.4 g/dL (ref 30.0–36.0)
MCV: 89.7 fL (ref 80.0–100.0)
MCV: 90.4 fL (ref 80.0–100.0)
Platelets: 37 10*3/uL — ABNORMAL LOW (ref 150–400)
Platelets: 22 10*3/uL — CL (ref 150–400)
RBC: 3.3 MIL/uL — ABNORMAL LOW (ref 4.22–5.81)
RBC: 3.02 MIL/uL — ABNORMAL LOW (ref 4.22–5.81)
RDW: 14.1 % (ref 11.5–15.5)
RDW: 14.6 % (ref 11.5–15.5)
WBC: 9.4 10*3/uL (ref 4.0–10.5)
WBC: 10.1 10*3/uL (ref 4.0–10.5)
nRBC: 0 % (ref 0.0–0.2)
nRBC: 0 % (ref 0.0–0.2)

## 2019-11-24 LAB — BPAM PLATELET PHERESIS
Blood Product Expiration Date: 202105081527
Blood Product Expiration Date: 202105092359
Blood Product Expiration Date: 202105081527
ISSUE DATE / TIME: 202105071519
ISSUE DATE / TIME: 202105072254
ISSUE DATE / TIME: 202105072254
Unit Type and Rh: 5100
Unit Type and Rh: 6200
Unit Type and Rh: 6200

## 2019-11-24 LAB — PREPARE PLATELET PHERESIS
Unit division: 0
Unit division: 0
Unit division: 0

## 2019-11-24 LAB — PROCALCITONIN: Procalcitonin: 85.41 ng/mL

## 2019-11-24 LAB — BLOOD PRODUCT ORDER (VERBAL) VERIFICATION

## 2019-11-24 LAB — GLUCOSE, CAPILLARY
Glucose-Capillary: 89 mg/dL (ref 70–99)
Glucose-Capillary: 104 mg/dL — ABNORMAL HIGH (ref 70–99)
Glucose-Capillary: 89 mg/dL (ref 70–99)

## 2019-11-24 LAB — MAGNESIUM: Magnesium: 2.3 mg/dL (ref 1.7–2.4)

## 2019-11-24 LAB — HEPARIN INDUCED PLATELET AB (HIT ANTIBODY): Heparin Induced Plt Ab: 0.052 OD (ref 0.000–0.400)

## 2019-11-24 MED ORDER — ACETAMINOPHEN 325 MG PO TABS: 650.0000 mg | ORAL_TABLET | Freq: Four times a day (QID) | ORAL | Status: DC | PRN

## 2019-11-24 MED ORDER — ALBUMIN HUMAN 25 % IV SOLN
25.0000 g | Freq: Four times a day (QID) | INTRAVENOUS | Status: DC
  Administered 2019-11-24 (×2): 25 g via INTRAVENOUS
  Filled 2019-11-24 (×2): qty 100

## 2019-11-24 MED ORDER — FENTANYL BOLUS VIA INFUSION
100.0000 ug | INTRAVENOUS | Status: DC | PRN
  Filled 2019-11-24: qty 100

## 2019-11-24 MED ORDER — POLYVINYL ALCOHOL 1.4 % OP SOLN
1.0000 [drp] | Freq: Four times a day (QID) | OPHTHALMIC | Status: DC | PRN
  Filled 2019-11-24: qty 15

## 2019-11-24 MED ORDER — GLYCOPYRROLATE 0.2 MG/ML IJ SOLN: 0.2000 mg | INTRAMUSCULAR | Status: DC | PRN

## 2019-11-24 MED ORDER — GLYCOPYRROLATE 0.2 MG/ML IJ SOLN
0.2000 mg | INTRAMUSCULAR | Status: DC | PRN
  Filled 2019-11-24: qty 1

## 2019-11-24 MED ORDER — FENTANYL CITRATE (PF) 100 MCG/2ML IJ SOLN: 50.0000 ug | INTRAMUSCULAR | Status: DC | PRN

## 2019-11-24 MED ORDER — FENTANYL 2500MCG IN NS 250ML (10MCG/ML) PREMIX INFUSION
0.0000 ug/h | INTRAVENOUS | Status: DC
  Administered 2019-11-24: 50 ug/h via INTRAVENOUS
  Filled 2019-11-24: qty 250

## 2019-11-24 MED ORDER — VANCOMYCIN HCL 750 MG/150ML IV SOLN: 750.0000 mg | INTRAVENOUS | Status: DC

## 2019-11-24 MED ORDER — HYDROCORTISONE NA SUCCINATE PF 100 MG IJ SOLR
50.0000 mg | Freq: Four times a day (QID) | INTRAMUSCULAR | Status: DC
  Administered 2019-11-24 (×2): 50 mg via INTRAVENOUS
  Filled 2019-11-24 (×2): qty 2

## 2019-11-24 MED ORDER — SODIUM CHLORIDE 0.9 % IV SOLN
2.0000 g | Freq: Two times a day (BID) | INTRAVENOUS | Status: DC
  Administered 2019-11-24: 2 g via INTRAVENOUS
  Filled 2019-11-24 (×2): qty 2

## 2019-11-24 MED ORDER — VANCOMYCIN HCL 1250 MG/250ML IV SOLN
1250.0000 mg | Freq: Once | INTRAVENOUS | Status: AC
  Administered 2019-11-24: 1250 mg via INTRAVENOUS
  Filled 2019-11-24: qty 250

## 2019-11-24 MED ORDER — DEXTROSE 5 % IV SOLN: INTRAVENOUS | Status: DC

## 2019-11-24 MED ORDER — GLYCOPYRROLATE 1 MG PO TABS
1.0000 mg | ORAL_TABLET | ORAL | Status: DC | PRN
  Filled 2019-11-24: qty 1

## 2019-11-24 MED ORDER — ACETAMINOPHEN 650 MG RE SUPP: 650.0000 mg | Freq: Four times a day (QID) | RECTAL | Status: DC | PRN

## 2019-11-24 MED ORDER — DIPHENHYDRAMINE HCL 50 MG/ML IJ SOLN: 25.0000 mg | INTRAMUSCULAR | Status: DC | PRN

## 2019-11-25 LAB — CALCIUM, IONIZED
Calcium, Ionized, Serum: 4.1 mg/dL — ABNORMAL LOW (ref 4.5–5.6)
Calcium, Ionized, Serum: 4.2 mg/dL — ABNORMAL LOW (ref 4.5–5.6)

## 2019-11-26 LAB — TYPE AND SCREEN
ABO/RH(D): B POS
Antibody Screen: NEGATIVE
Unit division: 0
Unit division: 0
Unit division: 0
Unit division: 0

## 2019-11-26 LAB — BPAM FFP
Blood Product Expiration Date: 202105112359
Blood Product Expiration Date: 202105112359
Blood Product Expiration Date: 202105122359
Blood Product Expiration Date: 202105122359
Blood Product Expiration Date: 202105222359
Blood Product Expiration Date: 202105222359
ISSUE DATE / TIME: 202105071516
ISSUE DATE / TIME: 202105071516
ISSUE DATE / TIME: 202105071617
ISSUE DATE / TIME: 202105071617
ISSUE DATE / TIME: 202105072128
ISSUE DATE / TIME: 202105072128
Unit Type and Rh: 600
Unit Type and Rh: 600
Unit Type and Rh: 6200
Unit Type and Rh: 6200
Unit Type and Rh: 7300
Unit Type and Rh: 7300

## 2019-11-26 LAB — PREPARE FRESH FROZEN PLASMA
Unit division: 0
Unit division: 0
Unit division: 0

## 2019-11-26 LAB — CULTURE, RESPIRATORY W GRAM STAIN: Culture: NORMAL

## 2019-11-26 LAB — BPAM RBC
Blood Product Expiration Date: 202106042359
Blood Product Expiration Date: 202106052359
Blood Product Expiration Date: 202106052359
Blood Product Expiration Date: 202106102359
ISSUE DATE / TIME: 202105071518
ISSUE DATE / TIME: 202105071518
ISSUE DATE / TIME: 202105071558
ISSUE DATE / TIME: 202105071558
Unit Type and Rh: 5100
Unit Type and Rh: 5100
Unit Type and Rh: 7300
Unit Type and Rh: 7300

## 2019-12-18 NOTE — Progress Notes (Signed)
240 mL Fentanyl bag wasted in Steri cycle with Lauro Franklin, RN.

## 2019-12-18 NOTE — Progress Notes (Signed)
Pharmacy Antibiotic Note  Lawrence Dennis was admitted on Nov 22, 2019 s/p PEA arrest with sepsis/possible aspiration PNA.  Pharmacy has been consulted to restart Cefepime and Vancomycin.  CRRT started 5/5 and continues. Vomiting with likely aspiration event noted 5/7.   -WBC= 9, afebrile -SCr currently 1.59 on replacement -Respiratory culture being drawn, other blood cxs were negative.   Plan: -Restart cefepime to 2 g IV Q12 hours (due to CRRT) -Reload vancomycin 1250mg  IV once then 750mg  q24 hours   Height: 6' (182.9 cm) Weight: 70.9 kg (156 lb 4.9 oz) IBW/kg (Calculated) : 77.6  Temp (24hrs), Avg:97.5 F (36.4 C), Min:93.3 F (34.1 C), Max:99.1 F (37.3 C)  Recent Labs  Lab 2019-11-22 1007 11-22-19 1046 11/22/19 1939 11-22-19 2347 Nov 22, 2019 2348 11/19/19 0505 11/19/19 0657 11/20/19 1608 11/20/19 1814 11/22/19 0400 11/22/19 0400 11/22/19 1030 11/22/19 1600 11/23/19 0400 11/23/19 1645 11/23/19 2129 12/03/2019 0307  WBC 17.9*  --   --   --   --   --    < >  --    < > 7.9  --   --   --  7.8 6.8 8.0 9.4  CREATININE 3.67*   < >  --    < >  --  3.00*   < >  --    < > 2.93*   < > 2.55* 2.24* 1.92* 1.53*  --  1.59*  LATICACIDVEN >11.0*  --  7.5*  --  7.7* 9.0*  --  4.8*  --   --   --   --   --   --   --   --   --    < > = values in this interval not displayed.    Estimated Creatinine Clearance: 56.4 mL/min (A) (by C-G formula based on SCr of 1.59 mg/dL (H)).    Allergies  Allergen Reactions  . Morphine And Related Itching    Listed as "allergic" in the other chart in Epic    Antimicrobials this admission: Vanc 5/2 >> 5/4, 5/8>> Cefepime 5/2 >> 5/6, 5/8>>  Microbiology results: 5/2 blood x2- ngF 5/8 Resp cx:  MRSA PCR- neg  Thank you for allowing pharmacy to be a part of this patient's care.  7/2 PharmD., BCPS Clinical Pharmacist 12/03/2019 8:43 AM

## 2019-12-18 NOTE — Progress Notes (Signed)
RT note-Patient was extubated per order, family and physician request for comfort care.

## 2019-12-18 NOTE — Progress Notes (Signed)
[facility] Progress Note  CC:  UGI bleed   Subjective: Patient is unresponsive intubated on the ventilator.  No family at the bedside. Plan for Brain MRI today.    Objective:  Vital signs in last 24 hours: Temp:  [93.3 F (34.1 C)-98.6 F (37 C)] 98 F (36.7 C) (05/08 0215) Pulse Rate:  [91-103] 91 (05/07 1521) Resp:  [17-36] 19 (05/08 0545) BP: (56-126)/(38-100) 56/42 (05/07 1600) SpO2:  [89 %-100 %] 98 % (05/08 0545) Arterial Line BP: (83-127)/(51-70) 104/55 (05/08 0545) FiO2 (%):  [40 %-100 %] 60 % (05/08 0408) Last BM Date: 11/22/19   General: Critically ill unresponsive male Heart: Tachycardic, no murmurs. Pulm: Coarse breath sounds throughout. Abdomen: Flat, slightly firm without distention.  No significant bowel sounds auscultated with the NG tube briefly clamped.  400 cc of brownish secretions in the NG container. Extremities: Feet cyanotic appearing. Neurologic: Responsive.  Intake/Output from previous day: 05/07 0701 - 05/08 0700 In: 4577.1 [I.V.:2586.2; XYVOP:9292; NG/GT:245; IV Piggyback:491.9] Out: 4350 [Emesis/NG output:3600] Intake/Output this shift: Total I/O In: 1732.4 [I.V.:951.1; Blood:550; NG/GT:90; IV Piggyback:141.3] Out: 484 [Emesis/NG output:500]  Lab Results: Recent Labs    11/23/19 1645 11/23/19 1645 11/23/19 1650 11/23/19 2024 11/23/19 2129 12-15-19 0307 Dec 15, 2019 0313  WBC 6.8  --   --   --  8.0 9.4  --   HGB 11.4*  --   --    < > 11.5* 10.5* 9.2*  HCT 32.7*  --   --    < > 32.6* 29.6* 27.0*  PLT 35*   < > 36*  --  21* 37*  --    < > = values in this interval not displayed.   BMET Recent Labs    11/23/19 0400 11/23/19 0400 11/23/19 1645 11/23/19 1645 11/23/19 2024 2019-12-15 0307 2019/12/15 0313  NA 138   < > 135   < > 135 136 134*  K 4.6   < > 5.0   < > 4.3 3.7 3.6  CL 100  --  99  --   --  101  --   CO2 27  --  23  --   --  24  --   GLUCOSE 92  --  93  --   --  93  --   BUN 40*  --  34*  --   --  33*   --   CREATININE 1.92*  --  1.53*  --   --  1.59*  --   CALCIUM 7.6*  --  6.7*  --   --  7.1*  --    < > = values in this interval not displayed.   LFT Recent Labs    11/23/19 0400 11/23/19 1645 15-Dec-2019 0307  PROT 5.1*  --   --   ALBUMIN 1.8*  1.8*   < > 1.6*  AST 851*  --   --   ALT 746*  --   --   ALKPHOS 154*  --   --   BILITOT 1.6*  --   --   BILIDIR 0.7*  --   --   IBILI 0.9  --   --    < > = values in this interval not displayed.   PT/INR Recent Labs    11/23/19 1650  LABPROT 13.2  INR 1.0   Hepatitis Panel No results for input(s): HEPBSAG, HCVAB, HEPAIGM, HEPBIGM in the last 72 hours.  DG Chest Port 1 View  Result Date: 11/23/2019 CLINICAL  DATA:  Aspiration into airway. EXAM: PORTABLE CHEST 1 VIEW COMPARISON:  One-view chest x-ray 11/21/2019 FINDINGS: The heart size is normal. Left IJ dialysis catheter is stable. Right IJ line is stable. The endotracheal tube is stable. No focal airspace disease is present. No edema or effusion is present. No pleural effusion or pneumothorax is present. IMPRESSION: 1. Stable support apparatus. 2. No acute cardiopulmonary disease. Electronically Signed   By: San Morelle M.D.   On: 11/23/2019 04:51    Assessment / Plan:  61.  49 year old male s/p PEA arrest with UGI bleed with anemia in setting of DIC and hemorrhagic shock.  Hg 13.7 -> 10.8. Received 2 units of PRBCs 5/7. Today Hg 10.5 -> 9.2. On Levophend and Vasopressin.  -Continue Pantoprazole 8 mg/hour infusion -No plans for endoscopic evaluation at this time due to significant thrombocytopenia/coagulopathy and hypotension/shock of dual pressors.  -Serial H/H -Transfuse as needed   2. Profound thrombocytopenia. PLT 18. Received platelet transfusion x 2 on 5/7. Today PLT 37.   3. Coagulopathy.  D-Dimer 3.71. Fibrinogen > 800. PTT 45. INR 1.0. Received 2 units of FFP 5/7.   4. Elevated LFTs. Labs 5/7: Alk phos 154. AST 851. ALT 746. T. Bili 1.6. Direct bili 0.7. Abd U/S  11/19/19 - liver unremarkable.  Small amount of perihepatic and pericholecystic ascites. Patent portal vein. Most likely shock liver but acute hepatitis panel to be checked due to history of polysubstance abuse (urine tox + cocaine, ? Hx of IVDU). -Repeat hepatic panel -Acute hepatitis panel   5. Acute respiratory failure, intubated  6. AKI -Nephrology following, plans for CRRT  Further recommendations per Dr. Tarri Glenn  Active Problems:   Cardiac arrest [facility])   Protein-calorie malnutrition, severe   Gastrointestinal hemorrhage     LOS: 6 days   Noralyn Pick  12-16-19, 0:828 AM

## 2019-12-18 NOTE — Progress Notes (Signed)
Pt transferred to and from MRI on the ventilator without incident. 

## 2019-12-18 NOTE — Progress Notes (Signed)
CDS updated on care withdrawal - asked to call with time of death.

## 2019-12-18 NOTE — Discharge Summary (Signed)
Date of Admission: 2019/11/28 Date of Death: 12-04-2019 Diagnoses:  Out of hospital cardiac arrest in setting of longstanding polysubstance abuse, likely seizure  Severe anoxic brain injury  Hospital Course:  Unknown age male [place] around [place].  EMS called and as they were loading him into truck he lost pulse.  ~6 mins CPR before ROSC.  Poor mental status after ROSC.  CT head and CXR benign.  Initial lactate >11 and initial pH 6.5.  Utox cocaine/THC/amphetamines.  Patient was resuscitated and underwent targeted temperature management in an attempt to preserve neurological function.  Unfortunately despite this he continued to show signs of severe anoxic brain injury which was confirmed with MRI.  We located family including his mother and discussed prognosis and what Mosie would want in such a situation.   Mother stated he would never want prolonged life support and he was allowed to pass in peace.  Myrla Halsted MD PCCM

## 2019-12-18 NOTE — Progress Notes (Signed)
Chaplain attended to family during EOL; mother stated that pt is at peace now. Chaplain provided positive emotional regard.  Family declined further support. Please contact if further services are needed.  Theodoro Parma [phone]   11/23/2019 1700  Clinical Encounter Type  Visited With Patient and family together  Visit Type Initial;Patient actively dying  Referral From Nurse  Consult/Referral To Chaplain  Stress Factors  Patient Stress Factors Major life changes  Family Stress Factors Loss

## 2019-12-18 NOTE — Progress Notes (Signed)
NAME:  Lawrence Dennis, MRN:  [id number], DOB:  04-03-1971, LOS: 6 ADMISSION DATE:  12-09-2019, CONSULTATION DATE:  12-09-19 REFERRING MD:  Dr. Jeanell Sparrow, CHIEF COMPLAINT:  Arrest   Brief History   49 year old man presenting with PEA arrest. Initially a [name].  History of present illness   Unknown age [place].  EMS called and as they were loading him into truck he lost pulse.  ~6 mins CPR before ROSC.  Poor mental status after ROSC.  CT head and CXR benign.  Initial lactate >11 and initial pH 6.5.  Utox cocaine/THC/amphetamines.  Past Medical History  Major depression and longstanding drug use.   Significant Hospital Events   5/2 admitted  Consults:  PCCM  Procedures:  5/2: ETT>> R rad A line 5/2>>> R IJ CVL 5/2>>>  Significant Diagnostic Tests:  CT Head: no acute injury CXR: hyperinflation otherwise normal 2D echo 5/3>> EF 50-55%, mod AS abd u/s 5/3>>> Small amount of perihepatic and pericholecystic ascites. Increased echotexture in the kidneys suggesting chronic medical renal disease.  Micro Data:  COVID>>NEG  Antimicrobials:  5/2 Vanc >> 5/2 Cefepime >>   Interim history/subjective:  Remains poorly responsive on escalating pressors.  Objective   Blood pressure (!) 56/42, pulse 91, temperature 98.2 F (36.8 C), temperature source Oral, resp. rate (!) 25, height 6' (1.829 m), weight 70.9 kg, SpO2 97 %. CVP:  [6 mmHg-21 mmHg] 6 mmHg  Vent Mode: PCV FiO2 (%):  [60 %-100 %] 60 % Set Rate:  [25 bmp] 25 bmp PEEP:  [5 cmH20] 5 cmH20 Plateau Pressure:  [18 cmH20-33 cmH20] 28 cmH20   Intake/Output Summary (Last 24 hours) at 12/06/2019 0806 Last data filed at 11/21/2019 0700 Gross per 24 hour  Intake 4724.29 ml  Output 4209 ml  Net 515.29 ml   Filed Weights   11/21/19 0500 11/23/19 0500 11/27/2019 0700  Weight: 70.7 kg 70 kg 70.9 kg    Examination: GEN: Ill-appearing man on mechanical ventilation HEENT: Endotracheal tube in place  with small mucoid secretions CV: Heart sounds are regular, extremities are cool to touch PULM: Scattered rhonchi, triggering ventilator GI: Soft, hypoactive bowel sounds, NG tube with large amount of bilious fluid EXT: Mottled, ischemic changes of digits noted NEURO: He will raise his eyebrows to voice and pain, otherwise I can get no motor response, has pupillary/corneal/cough/gag reflexes still present  PSYCH: cannot assess SKIN: Cold, mottled, ischemic   Resolved Hospital Problem list    Possible stress cardiomyopathy with type II myocardial injury - resolved. Echo relatively normal   Possible aspiration pneumonia as suggested by copious secretions hyperinflation on CXR suggests underlying COPD.  Culture negative to date. PLAN -  - Bronchodilators - Secretions improved 5/8 - Off abx since 5/5  Shock liver with elevated transaminases.  Continues to improve No evidence of hepatic dysfunction.  Normal ultrasound. -Follow liver profile.  Assessment & Plan:    Shock- was thought hemorrhagic yesterday although H/H has been relatively stable.  WBC not very elevated.  Some secretions and rhonci, reasonable to check sputum and start abx given degree of vasoplegia. - Continue levophed vasopressin titrated to MAP > 65 - Stress steroids, albumin - Sputum, pct, Vanc/cefepime - Even on CRRT if able  Possible hemorrhagic shock- NG output now looks more bilious.  Improved with 1 unit of blood and 1 unit platelets. - Seen by GI who will defer endoscopy given thrombocytopenia. - NG to LIS for now - PPI BID -  CBC BID  Out of hospital PEA arrest requiring TTM.  Short down time and immediate CPR - potential for reasonable neurological recovery.  Cause of arrest as yet unclear. -TTM completed. -Observe for neurological recovery. -MRI today if stable enough  Acute hypoxemic respiratory failure due to cardiac arrest requiring mechanical ventilation.  PLAN -  -Continue vent and VAP prevention  bundle  Acute kidney injury due to hypotension and possible drug ingestions. Associated hyperkalemia and oliguria.  Mild kidney echogenicity. Likely combination of hypotension and rhabdomyolysis. - CRRT for electrolyte correction. Appreciate nephrology assistance - Minimize nephrotoxins and   Thrombocytopenia of acute illness.  No bleeding diatheses at present. -HITT panel: resend, first one rejected as not frozen -Heparin stopped -Monitor  Goals of care- will probably need MRI to help guide family but overall does not appear like he will have good prognosis  Daily Goals Checklist   Best practice:  Diet: NPO, NGT to LIS Pain/Anxiety/Delirium protocol (if indicated): off all sedation VAP protocol (if indicated): in place DVT prophylaxis: SCD GI prophylaxis: PPI Glucose control: SSI Mobility: BR Code Status: full Family Communication: awaiting arrival Disposition: ICU    The patient is critically ill with multiple organ systems failure and requires high complexity decision making for assessment and support, frequent evaluation and titration of therapies, application of advanced monitoring technologies and extensive interpretation of multiple databases. Critical Care Time devoted to patient care services described in this note independent of APP/resident time (if applicable)  is 50 minutes.   Myrla Halsted MD [facility] 12/10/2019 8:22 AM Personal pager: [phone] If unanswered, please page CCM On-call: #[phone]

## 2019-12-18 NOTE — Progress Notes (Signed)
Patient ID: Lawrence Dennis, male   DOB: 24-Mar-1971, 49 y.o.   MRN: [id number] S: Pt developed hemotemasis and worsening hypotension overnight with a drop in Hgb and requiring escalating pressor support.  BP not responding to blood or increasing pressors. O:BP (!) 56/42   Pulse 91   Temp 98.2 F (36.8 C) (Oral)   Resp (!) 23   Ht 6' (1.829 m)   Wt 70.9 kg   SpO2 97%   BMI 21.20 kg/m   Intake/Output Summary (Last 24 hours) at 12/15/19 1002 Last data filed at 2019-12-15 0900 Gross per 24 hour  Intake 4821.84 ml  Output 4190 ml  Net 631.84 ml   Intake/Output: I/O last 3 completed shifts: In: 5489.4 [I.V.:2958.5; UKGUR:4270; NG/GT:785; IV Piggyback:491.9] Out: 6034 [Emesis/NG output:3600; Other:2434]  Intake/Output this shift:  Total I/O In: 197.6 [I.V.:195.1; IV Piggyback:2.5] Out: 220 [Emesis/NG output:100; Other:120] Weight change: 0.9 kg Gen: Intubated/sedated CVS: RRR, no rub Resp: occ rhonchi Abd: +BS< soft Ext: 1+ edema of lower ext which have mottled appearance bilaterally  Recent Labs  Lab 12/05/2019 1007 11/25/2019 1044 11/19/19 0505 11/19/19 0532 11/20/19 0444 11/20/19 1529 11/21/19 0438 11/21/19 1300 11/21/19 1555 11/21/19 1555 11/21/19 1800 11/21/19 1800 11/22/19 0400 11/22/19 0415 11/22/19 1030 11/22/19 1600 11/23/19 0400 11/23/19 1645 11/23/19 2024 12-15-2019 0307 12/15/2019 0313  NA 146*   < > 141   < > 141   < > 142   < > 141   < > 141   < > 138   < > 137 138 138 135 135 136 134*  K 5.1   < > 3.1*   < > 3.8   < > 5.9*   < > 5.9*   < > 6.0*   < > 5.2*   < > 5.4* 5.3* 4.6 5.0 4.3 3.7 3.6  CL 109   < > 101   < > 100   < > 101   < > 105   < > 103  --  103  --  103 100 100 99  --  101  --   CO2 <7*   < > 20*   < > 27  --  24   < > 24   < > 24  --  25  --  26 27 27 23   --  24  --   GLUCOSE 66*   < > 229*   < > 61*   < > 135*   < > 133*   < > 145*  --  152*  --  118* 106* 92 93  --  93  --   BUN 31*   < > 40*   < > 47*   < > 70*   < > 64*   < > 58*  --   50*  --  48* 46* 40* 34*  --  33*  --   CREATININE 3.67*   < > 3.00*   < > 3.46*   < > 4.73*   < > 3.99*   < > 3.72*  --  2.93*  --  2.55* 2.24* 1.92* 1.53*  --  1.59*  --   ALBUMIN 3.2*   < > 2.4*   < > 2.0*  --  1.8*  --  1.7*  --   --   --  1.5*  --  1.7* 1.8* 1.8*  1.8* 1.7*  --  1.6*  --   CALCIUM 8.9   < > 6.7*   < >  6.6*  --  6.3*   < > 6.5*   < > 6.6*  --  6.7*  --  6.8* 7.1* 7.6* 6.7*  --  7.1*  --   PHOS  --   --  7.6*   < >  --   --  4.8*  --  5.1*  --   --   --   --   --  4.8* 3.9 1.9* 7.4*  --  3.1  --   AST 3,759*  --  4,857*  --  2,022*  --  1,367*  --   --   --   --   --  960*  --   --   --  851*  --   --   --   --   ALT 2,472*  --  2,635*  --  1,752*  --  1,229*  --   --   --   --   --  906*  --   --   --  746*  --   --   --   --    < > = values in this interval not displayed.   Liver Function Tests: Recent Labs  Lab 11/21/19 0438 11/21/19 1555 11/22/19 0400 11/22/19 1030 11/23/19 0400 11/23/19 1645 12/04/19 0307  AST 1,367*  --  960*  --  851*  --   --   ALT 1,229*  --  906*  --  746*  --   --   ALKPHOS 92  --  101  --  154*  --   --   BILITOT 1.7*  --  1.2  --  1.6*  --   --   PROT 4.2*  --  4.4*  --  5.1*  --   --   ALBUMIN 1.8*   < > 1.5*   < > 1.8*  1.8* 1.7* 1.6*   < > = values in this interval not displayed.   No results for input(s): LIPASE, AMYLASE in the last 168 hours. No results for input(s): AMMONIA in the last 168 hours. CBC: Recent Labs  Lab 11/22/19 0400 11/22/19 0415 11/23/19 0400 11/23/19 0400 11/23/19 1645 11/23/19 1645 11/23/19 1650 11/23/19 2024 11/23/19 2129 12/04/2019 0307 Dec 04, 2019 0313  WBC 7.9   < > 7.8   < > 6.8  --   --   --  8.0 9.4  --   NEUTROABS  --   --   --   --  5.9  --   --   --  6.9  --   --   HGB 10.7*   < > 10.8*   < > 11.4*  --   --    < > 11.5* 10.5* 9.2*  HCT 30.8*   < > 31.8*   < > 32.7*  --   --    < > 32.6* 29.6* 27.0*  MCV 92.2  --  93.0  --  92.4  --   --   --  90.3 89.7  --   PLT 30*   < > 18*   < > 35*    < > 36*  --  21* 37*  --    < > = values in this interval not displayed.   Cardiac Enzymes: Recent Labs  Lab 11/25/2019 1530 11/19/19 0505 11/20/19 0952 11/23/19 0918  CKTOTAL 9,230* 18,036* 18,651* 4,508*   CBG: Recent Labs  Lab 11/23/19 2003 11/23/19 2027 11/23/19 2344 12-04-19 8921 2019-12-04 0757  GLUCAP  67* 120* 79 89 104*    Iron Studies: No results for input(s): IRON, TIBC, TRANSFERRIN, FERRITIN in the last 72 hours. Studies/Results: DG Chest Port 1 View  Result Date: 11/23/2019 CLINICAL DATA:  Aspiration into airway. EXAM: PORTABLE CHEST 1 VIEW COMPARISON:  One-view chest x-ray 11/21/2019 FINDINGS: The heart size is normal. Left IJ dialysis catheter is stable. Right IJ line is stable. The endotracheal tube is stable. No focal airspace disease is present. No edema or effusion is present. No pleural effusion or pneumothorax is present. IMPRESSION: 1. Stable support apparatus. 2. No acute cardiopulmonary disease. Electronically Signed   By: San Morelle M.D.   On: 11/23/2019 04:51   . B-complex with vitamin C  1 tablet Oral Daily  . chlorhexidine gluconate (MEDLINE KIT)  15 mL Mouth Rinse BID  . Chlorhexidine Gluconate Cloth  6 each Topical Daily  . folic acid  1 mg Oral Daily  . hydrocortisone sod succinate (SOLU-CORTEF) inj  50 mg Intravenous Q6H  . insulin aspart  0-6 Units Subcutaneous Q4H  . mouth rinse  15 mL Mouth Rinse 10 times per day  . [START ON 11/27/2019] pantoprazole  40 mg Intravenous Q12H  . sodium chloride flush  10-40 mL Intracatheter Q12H  . thiamine  100 mg Oral Daily    BMET    Component Value Date/Time   NA 134 (L) 2019-12-03 0313   K 3.6 12/03/19 0313   CL 101 2019-12-03 0307   CO2 24 12-03-19 0307   GLUCOSE 93 Dec 03, 2019 0307   BUN 33 (H) Dec 03, 2019 0307   CREATININE 1.59 (H) Dec 03, 2019 0307   CALCIUM 7.1 (L) Dec 03, 2019 0307   GFRNONAA 50 (L) 12/03/19 0307   GFRAA 58 (L) 12-03-19 0307   CBC    Component Value Date/Time    WBC 9.4 12-03-2019 0307   RBC 3.30 (L) 03-Dec-2019 0307   HGB 9.2 (L) 03-Dec-2019 0313   HCT 27.0 (L) 03-Dec-2019 0313   PLT 37 (L) 12/03/19 0307   MCV 89.7 Dec 03, 2019 0307   MCH 31.8 12/03/2019 0307   MCHC 35.5 Dec 03, 2019 0307   RDW 14.1 2019/12/03 0307   LYMPHSABS 0.3 (L) 11/23/2019 2129   MONOABS 0.7 11/23/2019 2129   EOSABS 0.0 11/23/2019 2129   BASOSABS 0.0 11/23/2019 2129     Assessment/Plan: 1. AKI: Likely 2/2 hypoperfusion in the setting of shockas well as rhabdomyolysis.Baseline creatinine appears to be 1.Cr trend continues to rise, patient initially acidotic but has improved. UOP has been decreasing, Initially on 5/2, was 730, 5/3 was 1232, 5/4 was 60. Patient has been receiving D5NS at 75 cc/hr and currently also on levophed for BP support. Cr 3.67 on admission, decreased to 2.97, but has since progressively increased, now at 4.73. BUN also progressively increasing fom 31>70. CK elevated to 18,651, rhabdo also likely contributing.Appears euvolemic on examination. 1. StartedCRRT5/5/21 2. HD catheter placed by CCM5/5/21 and appears to be functioning well. 3. Will keep even given ongoing hypotension/shock not responding to escalating pressor support 2. Shock- initially thought to be hemorrhagic but not a sig drop in Hgb nor response to blood transfusions.  Overall prognosis looking grim despite maximal support. 3. AMS: Initial EEG with encephalopathic changes. Now with improvement on LTM. Overall mental status also improving per notes, although patientis not following commands 4. Acute hypoxemic respiratory failure secondary to cardiac arrest: Currently intubated on mechanical ventilation. Per CCM. 5. Suspected aspiration pneumonia: Currently on cefepime(5/2- ). S/p vancomycin 5/2-5/3. Management per primary. 6. Shock: Per CCM. Currentlyofflevophed. AST/ALT  also elevatedwith increase in troponin. Likely main source of problem #1. 7. Rhabdomyolysis- will  follow serial CK levels with CRRT 8. Hyperkalemia: K 5.9, s/p lokelma and calcium gluconate.Still with elevated potassium but improving with CRRT. 1. Using 2K bath for all fluids, finally improved to 4.6 9. Protein malnutrition- severe albumin 1.i 10. Hypophosphatemia- will replete IV and follow 11. Normocytic anemia: Hgb 13.7 on admission, now10.5. Likely dilutional effect with fluids. Continue to monitor.  12. Thrombocytopenia: Plt30, down from 160 on admission. 13. Drug Abuse: UDS positive forcocaine, THC, amphetamines.Will need counseling 14. Disposition- pt without any significant improvement and now with an acute decline hemodynamically.  Agree with MRI and ongoing discussions with the family regarding goals/limits of care.  May need to transition to comfort measures.  Donetta Potts, MD [employer] [phone]

## 2019-12-18 DEATH — deceased
